# Patient Record
Sex: Male | Born: 1955 | Race: White | Hispanic: No | Marital: Married | Smoking: Never smoker
Health system: Southern US, Community
[De-identification: ages and names within clinical notes are randomized; demographics above are authoritative.]

## PROBLEM LIST (undated history)

## (undated) DIAGNOSIS — E039 Hypothyroidism, unspecified: Secondary | ICD-10-CM

## (undated) DIAGNOSIS — Z87442 Personal history of urinary calculi: Secondary | ICD-10-CM

## (undated) DIAGNOSIS — C801 Malignant (primary) neoplasm, unspecified: Secondary | ICD-10-CM

## (undated) DIAGNOSIS — J069 Acute upper respiratory infection, unspecified: Secondary | ICD-10-CM

## (undated) DIAGNOSIS — K219 Gastro-esophageal reflux disease without esophagitis: Secondary | ICD-10-CM

## (undated) DIAGNOSIS — M1990 Unspecified osteoarthritis, unspecified site: Secondary | ICD-10-CM

## (undated) DIAGNOSIS — Z8489 Family history of other specified conditions: Secondary | ICD-10-CM

## (undated) DIAGNOSIS — E7800 Pure hypercholesterolemia, unspecified: Secondary | ICD-10-CM

## (undated) DIAGNOSIS — I10 Essential (primary) hypertension: Secondary | ICD-10-CM

## (undated) DIAGNOSIS — G4730 Sleep apnea, unspecified: Secondary | ICD-10-CM

## (undated) HISTORY — PX: URETHRAL STRICTURE DILATATION: SHX477

## (undated) HISTORY — PX: TONSILLECTOMY: SUR1361

## (undated) HISTORY — PX: NASAL SEPTUM SURGERY: SHX37

---

## 1998-07-29 HISTORY — PX: UVULOPALATOPHARYNGOPLASTY: SHX827

## 2000-07-14 ENCOUNTER — Encounter: Payer: Self-pay | Admitting: Otolaryngology

## 2000-07-16 ENCOUNTER — Inpatient Hospital Stay (HOSPITAL_COMMUNITY): Admission: RE | Admit: 2000-07-16 | Discharge: 2000-07-18 | Payer: Self-pay | Admitting: Otolaryngology

## 2004-06-06 ENCOUNTER — Ambulatory Visit: Payer: Self-pay | Admitting: Internal Medicine

## 2004-11-19 ENCOUNTER — Ambulatory Visit: Payer: Self-pay | Admitting: Urology

## 2007-12-03 ENCOUNTER — Ambulatory Visit: Payer: Self-pay | Admitting: Unknown Physician Specialty

## 2008-12-23 ENCOUNTER — Emergency Department: Payer: Self-pay | Admitting: Unknown Physician Specialty

## 2009-01-05 ENCOUNTER — Ambulatory Visit: Payer: Self-pay | Admitting: Orthopedic Surgery

## 2009-01-11 ENCOUNTER — Ambulatory Visit: Payer: Self-pay | Admitting: Orthopedic Surgery

## 2009-04-19 ENCOUNTER — Ambulatory Visit: Payer: Self-pay | Admitting: Otolaryngology

## 2010-03-28 ENCOUNTER — Ambulatory Visit: Payer: Self-pay | Admitting: Chiropractic Medicine

## 2011-03-21 ENCOUNTER — Ambulatory Visit: Payer: Self-pay | Admitting: Urology

## 2011-04-25 ENCOUNTER — Ambulatory Visit: Payer: Self-pay | Admitting: Urology

## 2011-12-05 ENCOUNTER — Ambulatory Visit: Payer: Self-pay | Admitting: Urology

## 2012-01-06 ENCOUNTER — Ambulatory Visit: Payer: Self-pay | Admitting: Urology

## 2014-03-18 DIAGNOSIS — L0292 Furuncle, unspecified: Secondary | ICD-10-CM | POA: Insufficient documentation

## 2014-03-18 DIAGNOSIS — W57XXXA Bitten or stung by nonvenomous insect and other nonvenomous arthropods, initial encounter: Secondary | ICD-10-CM | POA: Insufficient documentation

## 2014-03-18 DIAGNOSIS — I10 Essential (primary) hypertension: Secondary | ICD-10-CM | POA: Insufficient documentation

## 2014-11-20 NOTE — Op Note (Signed)
PATIENT NAME:  Spencer Garrett, Spencer Garrett MR#:  [id number] DATE OF BIRTH:  04-21-1956  DATE OF PROCEDURE:  01/06/2012  PREOPERATIVE DIAGNOSES:  1. Urethral stone.  2. Urethral stricture.   POSTOPERATIVE DIAGNOSES: 1. Urethral stone.  2. Urethral stricture.   PROCEDURES: 1. Lithotripsy of the urethral stone with the holmium laser. 2. Visual internal urethrotomy with the holmium laser.  SURGEON: Otelia Limes. Dietrich Pates D   ANESTHETIST: Dr. Marcello Moores   ANESTHETIC METHOD: General per Dr. Marcello Moores and local per Dr. Yves Dill    INDICATIONS: See the dictated history and physical. After informed consent, the patient requested the above procedures.   OPERATIVE SUMMARY: After adequate general anesthesia had been obtained, the patient was placed into dorsal lithotomy position and the perineum was prepped and draped in the usual fashion. The 17 French cystoscope was coupled with the camera and then visually advanced into the distal urethra. Stone was encountered in the mid pendulous urethra. The patient had a stricture distal to that which did not allow passage of the scope. The 365 micron holmium laser fiber was then introduced through the scope and stricture was incised at the 12 o'clock position resulting in a 20 [place] urethra. Scope was then passed to the stone and the stone was actually pushed back to the bulbar urethra. In this location the stone was fully disintegrated with the holmium laser. Scope was then passed into the bladder and bladder was inspected. Both ureteral orifices were identified and had clear efflux. No bladder lesions were identified. At this point the scope was removed. 10 mL of viscous Xylocaine was then instilled within the urethra.   The procedure was then terminated and the patient was transferred to the recovery room in stable condition.   ____________________________ Otelia Limes. Yves Dill, MD [id number] D: 01/06/2012 11:22:46 ET T: 01/06/2012 13:06:53 ET JOB#: [id number]  cc: Otelia Limes. Yves Dill,  MD, <Dictator> Royston Cowper MD ELECTRONICALLY SIGNED 01/07/2012 12:16

## 2015-01-17 ENCOUNTER — Other Ambulatory Visit: Payer: Self-pay | Admitting: Otolaryngology

## 2015-01-17 DIAGNOSIS — R1310 Dysphagia, unspecified: Secondary | ICD-10-CM

## 2015-01-17 DIAGNOSIS — M542 Cervicalgia: Secondary | ICD-10-CM

## 2015-01-19 ENCOUNTER — Ambulatory Visit
Admission: RE | Admit: 2015-01-19 | Discharge: 2015-01-19 | Disposition: A | Discharge status: Home / Self Care | Payer: PRIVATE HEALTH INSURANCE | Source: Ambulatory Visit | Attending: Otolaryngology | Admitting: Otolaryngology

## 2015-01-19 DIAGNOSIS — R1310 Dysphagia, unspecified: Secondary | ICD-10-CM | POA: Insufficient documentation

## 2015-01-19 DIAGNOSIS — M542 Cervicalgia: Secondary | ICD-10-CM | POA: Insufficient documentation

## 2015-01-19 HISTORY — DX: Essential (primary) hypertension: I10

## 2015-01-19 MED ORDER — IOHEXOL 350 MG/ML SOLN
75.0000 mL | Freq: Once | INTRAVENOUS | Status: AC | PRN
  Administered 2015-01-19: 75 mL via INTRAVENOUS

## 2015-01-20 DIAGNOSIS — R0989 Other specified symptoms and signs involving the circulatory and respiratory systems: Secondary | ICD-10-CM | POA: Insufficient documentation

## 2015-01-20 DIAGNOSIS — F32A Depression, unspecified: Secondary | ICD-10-CM | POA: Insufficient documentation

## 2015-01-20 DIAGNOSIS — E039 Hypothyroidism, unspecified: Secondary | ICD-10-CM | POA: Insufficient documentation

## 2015-01-20 DIAGNOSIS — F329 Major depressive disorder, single episode, unspecified: Secondary | ICD-10-CM | POA: Insufficient documentation

## 2015-01-20 DIAGNOSIS — E0590 Thyrotoxicosis, unspecified without thyrotoxic crisis or storm: Secondary | ICD-10-CM | POA: Insufficient documentation

## 2015-02-01 ENCOUNTER — Other Ambulatory Visit: Payer: Self-pay

## 2015-02-06 ENCOUNTER — Encounter: Admission: RE | Payer: Self-pay | Source: Ambulatory Visit

## 2015-02-06 ENCOUNTER — Ambulatory Visit
Admission: RE | Admit: 2015-02-06 | Payer: PRIVATE HEALTH INSURANCE | Source: Ambulatory Visit | Admitting: Otolaryngology

## 2015-02-06 SURGERY — LARYNGOSCOPY
Anesthesia: Choice

## 2016-04-10 ENCOUNTER — Encounter: Payer: Self-pay | Admitting: Intensive Care

## 2016-04-10 ENCOUNTER — Inpatient Hospital Stay
Admission: EM | Admit: 2016-04-10 | Discharge: 2016-04-13 | DRG: 392 | Disposition: A | Discharge status: Home / Self Care | Payer: Managed Care, Other (non HMO) | Attending: Surgery | Admitting: Surgery

## 2016-04-10 ENCOUNTER — Ambulatory Visit
Admission: RE | Admit: 2016-04-10 | Discharge: 2016-04-10 | Disposition: A | Discharge status: Home / Self Care | Payer: Managed Care, Other (non HMO) | Source: Ambulatory Visit | Attending: Urology | Admitting: Urology

## 2016-04-10 ENCOUNTER — Other Ambulatory Visit: Payer: Self-pay | Admitting: Urology

## 2016-04-10 DIAGNOSIS — Z87442 Personal history of urinary calculi: Secondary | ICD-10-CM | POA: Diagnosis not present

## 2016-04-10 DIAGNOSIS — R1011 Right upper quadrant pain: Secondary | ICD-10-CM

## 2016-04-10 DIAGNOSIS — I10 Essential (primary) hypertension: Secondary | ICD-10-CM | POA: Diagnosis present

## 2016-04-10 DIAGNOSIS — K5792 Diverticulitis of intestine, part unspecified, without perforation or abscess without bleeding: Secondary | ICD-10-CM | POA: Diagnosis not present

## 2016-04-10 DIAGNOSIS — Z885 Allergy status to narcotic agent status: Secondary | ICD-10-CM

## 2016-04-10 DIAGNOSIS — K5720 Diverticulitis of large intestine with perforation and abscess without bleeding: Secondary | ICD-10-CM | POA: Diagnosis not present

## 2016-04-10 DIAGNOSIS — K5732 Diverticulitis of large intestine without perforation or abscess without bleeding: Principal | ICD-10-CM | POA: Diagnosis present

## 2016-04-10 HISTORY — DX: Pure hypercholesterolemia, unspecified: E78.00

## 2016-04-10 LAB — URINALYSIS COMPLETE WITH MICROSCOPIC (ARMC ONLY)
BACTERIA UA: NONE SEEN
Bilirubin Urine: NEGATIVE
Glucose, UA: NEGATIVE mg/dL
Ketones, ur: NEGATIVE mg/dL
Leukocytes, UA: NEGATIVE
Nitrite: NEGATIVE
PH: 5 (ref 5.0–8.0)
PROTEIN: NEGATIVE mg/dL
Specific Gravity, Urine: 1.034 — ABNORMAL HIGH (ref 1.005–1.030)

## 2016-04-10 LAB — COMPREHENSIVE METABOLIC PANEL
ALBUMIN: 3.9 g/dL (ref 3.5–5.0)
ALK PHOS: 52 U/L (ref 38–126)
ALT: 17 U/L (ref 17–63)
ANION GAP: 8 (ref 5–15)
AST: 19 U/L (ref 15–41)
BUN: 13 mg/dL (ref 6–20)
CALCIUM: 9 mg/dL (ref 8.9–10.3)
CHLORIDE: 96 mmol/L — AB (ref 101–111)
CO2: 29 mmol/L (ref 22–32)
Creatinine, Ser: 1.12 mg/dL (ref 0.61–1.24)
GFR calc Af Amer: >60 mL/min (ref >60)
GFR calc non Af Amer: >60 mL/min (ref >60)
GLUCOSE: 107 mg/dL — AB (ref 65–99)
Potassium: 3.4 mmol/L — ABNORMAL LOW (ref 3.5–5.1)
SODIUM: 133 mmol/L — AB (ref 135–145)
Total Bilirubin: 1.2 mg/dL (ref 0.3–1.2)
Total Protein: 7.6 g/dL (ref 6.5–8.1)

## 2016-04-10 LAB — CBC
HEMATOCRIT: 46 % (ref 40.0–52.0)
Hemoglobin: 15.8 g/dL (ref 13.0–18.0)
MCH: 29.9 pg (ref 26.0–34.0)
MCHC: 34.3 g/dL (ref 32.0–36.0)
MCV: 87.1 fL (ref 80.0–100.0)
Platelets: 187 10*3/uL (ref 150–440)
RBC: 5.28 MIL/uL (ref 4.40–5.90)
RDW: 14.1 % (ref 11.5–14.5)
WBC: 10.3 10*3/uL (ref 3.8–10.6)

## 2016-04-10 LAB — LIPASE, BLOOD: LIPASE: 25 U/L (ref 11–51)

## 2016-04-10 MED ORDER — MORPHINE SULFATE (PF) 2 MG/ML IV SOLN
2.0000 mg | INTRAVENOUS | Status: DC | PRN
  Administered 2016-04-10 – 2016-04-12 (×12): 2 mg via INTRAVENOUS
  Filled 2016-04-10 (×11): qty 1

## 2016-04-10 MED ORDER — PANTOPRAZOLE SODIUM 40 MG PO TBEC
40.0000 mg | DELAYED_RELEASE_TABLET | Freq: Every day | ORAL | Status: DC
  Administered 2016-04-10 – 2016-04-13 (×4): 40 mg via ORAL
  Filled 2016-04-10 (×4): qty 1

## 2016-04-10 MED ORDER — PIPERACILLIN-TAZOBACTAM 3.375 G IVPB 30 MIN: 3.3750 g | Freq: Three times a day (TID) | INTRAVENOUS | Status: DC

## 2016-04-10 MED ORDER — LISINOPRIL 10 MG PO TABS
10.0000 mg | ORAL_TABLET | Freq: Every day | ORAL | Status: DC
  Administered 2016-04-10 – 2016-04-12 (×3): 10 mg via ORAL
  Filled 2016-04-10 (×3): qty 1

## 2016-04-10 MED ORDER — MORPHINE SULFATE (PF) 2 MG/ML IV SOLN
INTRAVENOUS | Status: AC
  Administered 2016-04-10: 2 mg via INTRAVENOUS
  Filled 2016-04-10: qty 1

## 2016-04-10 MED ORDER — PIPERACILLIN-TAZOBACTAM 3.375 G IVPB
3.3750 g | Freq: Three times a day (TID) | INTRAVENOUS | Status: DC
  Administered 2016-04-10 – 2016-04-13 (×9): 3.375 g via INTRAVENOUS
  Filled 2016-04-10 (×9): qty 50

## 2016-04-10 MED ORDER — MORPHINE SULFATE (PF) 4 MG/ML IV SOLN
4.0000 mg | Freq: Once | INTRAVENOUS | Status: AC
  Administered 2016-04-10: 4 mg via INTRAVENOUS

## 2016-04-10 MED ORDER — ONDANSETRON HCL 4 MG PO TABS: 4.0000 mg | ORAL_TABLET | Freq: Four times a day (QID) | ORAL | Status: DC | PRN

## 2016-04-10 MED ORDER — IOPAMIDOL (ISOVUE-300) INJECTION 61%
100.0000 mL | Freq: Once | INTRAVENOUS | Status: AC | PRN
  Administered 2016-04-10: 100 mL via INTRAVENOUS

## 2016-04-10 MED ORDER — ATORVASTATIN CALCIUM 20 MG PO TABS
20.0000 mg | ORAL_TABLET | Freq: Every day | ORAL | Status: DC
  Administered 2016-04-10 – 2016-04-12 (×3): 20 mg via ORAL
  Filled 2016-04-10 (×3): qty 1

## 2016-04-10 MED ORDER — ONDANSETRON HCL 4 MG/2ML IJ SOLN
4.0000 mg | Freq: Once | INTRAMUSCULAR | Status: AC
  Administered 2016-04-10: 4 mg via INTRAVENOUS

## 2016-04-10 MED ORDER — ALPRAZOLAM 1 MG PO TABS
1.0000 mg | ORAL_TABLET | Freq: Every evening | ORAL | Status: DC | PRN
Start: 2016-04-10 — End: 2016-04-13
  Administered 2016-04-10 – 2016-04-12 (×3): 1 mg via ORAL
  Filled 2016-04-10 (×3): qty 1

## 2016-04-10 MED ORDER — ONDANSETRON HCL 4 MG/2ML IJ SOLN
4.0000 mg | Freq: Four times a day (QID) | INTRAMUSCULAR | Status: DC | PRN
  Filled 2016-04-10: qty 2

## 2016-04-10 MED ORDER — PHENTERMINE HCL 30 MG PO CAPS: 30.0000 mg | ORAL_CAPSULE | ORAL | Status: DC

## 2016-04-10 MED ORDER — HEPARIN SODIUM (PORCINE) 5000 UNIT/ML IJ SOLN
5000.0000 [IU] | Freq: Three times a day (TID) | INTRAMUSCULAR | Status: DC
  Administered 2016-04-11 – 2016-04-13 (×7): 5000 [IU] via SUBCUTANEOUS
  Filled 2016-04-10 (×8): qty 1

## 2016-04-10 MED ORDER — ALPRAZOLAM 0.5 MG PO TABS
0.2500 mg | ORAL_TABLET | Freq: Every evening | ORAL | Status: DC | PRN
  Filled 2016-04-10: qty 1

## 2016-04-10 MED ORDER — ONDANSETRON HCL 4 MG/2ML IJ SOLN
INTRAMUSCULAR | Status: AC
  Administered 2016-04-10: 4 mg via INTRAVENOUS
  Filled 2016-04-10: qty 2

## 2016-04-10 MED ORDER — HYDROCHLOROTHIAZIDE 25 MG PO TABS
25.0000 mg | ORAL_TABLET | Freq: Every day | ORAL | Status: DC
  Administered 2016-04-11 – 2016-04-13 (×3): 25 mg via ORAL
  Filled 2016-04-10 (×3): qty 1

## 2016-04-10 MED ORDER — KETOROLAC TROMETHAMINE 30 MG/ML IJ SOLN
30.0000 mg | Freq: Four times a day (QID) | INTRAMUSCULAR | Status: DC | PRN
  Administered 2016-04-10 – 2016-04-12 (×3): 30 mg via INTRAVENOUS
  Filled 2016-04-10 (×2): qty 1

## 2016-04-10 MED ORDER — MORPHINE SULFATE (PF) 4 MG/ML IV SOLN
INTRAVENOUS | Status: AC
  Administered 2016-04-10: 4 mg via INTRAVENOUS
  Filled 2016-04-10: qty 1

## 2016-04-10 MED ORDER — LEVOTHYROXINE SODIUM 50 MCG PO TABS
75.0000 ug | ORAL_TABLET | Freq: Every day | ORAL | Status: DC
  Administered 2016-04-11 – 2016-04-13 (×3): 75 ug via ORAL
  Filled 2016-04-10 (×3): qty 2

## 2016-04-10 MED ORDER — LACTATED RINGERS IV SOLN
INTRAVENOUS | Status: DC
  Administered 2016-04-10: 20:00:00 via INTRAVENOUS
  Administered 2016-04-11 (×2): 1000 mL via INTRAVENOUS
  Administered 2016-04-11 – 2016-04-12 (×3): via INTRAVENOUS

## 2016-04-10 NOTE — ED Provider Notes (Signed)
[facility] Emergency Department Provider Note   ____________________________________________   First MD Initiated Contact with Patient 04/10/16 1730     (approximate)  I have reviewed the triage vital signs and the nursing notes.   HISTORY  Chief Complaint Abdominal Pain and Diverticulitis    HPI Spencer Garrett is a 60 y.o. male patient reports onset of abdominal pain in the lower abdomen across the bottom of the abdomen patient went to medical and surgical urgent care and had a CT there that showed a microperforation and diverticulitis and sigmoid colon was sent here to the emergency room on arrival in the emergency room I examined the patient and certainly history was correct. I then called Dr. Burt Knack who is on-call for surgery and Dr. Antionette Char coming down to see the patient. Patient reports abdominal pain is moderate to moderately severe. It isn'Spencer across the lower abdomen and is worse with movement or percussion. He has some decreased appetite as I understand it.   Past Medical History:  Diagnosis Date  . High cholesterol   . Hypertension   . Thyroid disease     There are no active problems to display for this patient.   Past Surgical History:  Procedure Laterality Date  . URETHRAL STRICTURE DILATATION      Prior to Admission medications   Not on File    Allergies Pineapple  History reviewed. No pertinent family history.  Social History Social History  Substance Use Topics  . Smoking status: Never Smoker  . Smokeless tobacco: Never Used  . Alcohol use 1.8 oz/week    2 Cans of beer, 1 Shots of liquor per week     Comment: about 16oz of beer a day and 4oz of liquor a day    Review of Systems Constitutional: No fever/chills Eyes: No visual changes. ENT: No sore throat. Cardiovascular: Denies chest pain. Respiratory: Denies shortness of breath. Gastrointestinal: See history of present illness. Genitourinary: Negative for  dysuria. Musculoskeletal: Negative for back pain.   10-point ROS otherwise negative.  ____________________________________________   PHYSICAL EXAM:  VITAL SIGNS: ED Triage Vitals  Enc Vitals Group     BP 04/10/16 1520 130/89     Pulse Rate 04/10/16 1520 (!) 114     Resp 04/10/16 1520 20     Temp 04/10/16 1520 99 F (37.2 C)     Temp Source 04/10/16 1520 Oral     SpO2 04/10/16 1520 97 %     Weight 04/10/16 1521 244 lb (110.7 kg)     Height 04/10/16 1521 5\' 9"  (1.753 m)     Head Circumference --      Peak Flow --      Pain Score 04/10/16 1521 6     Pain Loc --      Pain Edu? --      Excl. in [place]? --     Constitutional: Alert and oriented. Well appearing and in no acute distress. Eyes: Conjunctivae are normal. PERRL. EOMI. Head: Atraumatic. Nose: No congestion/rhinnorhea. Mouth/Throat: Mucous membranes are moist.  Oropharynx non-erythematous. Neck: No stridor.   Cardiovascular: Normal rate, regular rhythm. Grossly normal heart sounds.  Good peripheral circulation. Respiratory: Normal respiratory effort.  No retractions. Lungs CTAB. Gastrointestinal:Abdomen is soft mildly tender to very light palpation percussion in the lower abdomen only I did not palpate or percuss any harde Musculoskeletal: No lower extremity tenderness nor edema.  No joint effusions. Neurologic:  Normal speech and language. No gross focal neurologic deficits are  appreciated. No gait instability. Skin:  Skin is warm, dry and intact. No rash noted. Psychiatric: Mood and affect are normal. Speech and behavior are normal.  ____________________________________________   LABS (all labs ordered are listed, but only abnormal results are displayed)  Labs Reviewed  COMPREHENSIVE METABOLIC PANEL - Abnormal; Notable for the following:       Result Value   Sodium 133 (*)    Potassium 3.4 (*)    Chloride 96 (*)    Glucose, Bld 107 (*)    All other components within normal limits  LIPASE, BLOOD  CBC   URINALYSIS COMPLETEWITH MICROSCOPIC ([facility] ONLY)   ____________________________________________  EKG   ____________________________________________  RADIOLOGY  Please see previous note for CT report which shows microperforation. ____________________________________________   PROCEDURES  Procedure(s) performed: Procedures  Critical Care performed: ____________________________________________   INITIAL IMPRESSION / ASSESSMENT AND PLAN / ED COURSE  Pertinent labs & imaging results that were available during my care of the patient were reviewed by me and considered in my medical decision making (see chart for details).    Clinical Course     ____________________________________________   FINAL CLINICAL IMPRESSION(S) / ED DIAGNOSES  Final diagnoses:  Diverticulitis of large intestine with perforation without bleeding      NEW MEDICATIONS STARTED DURING THIS VISIT:  New Prescriptions   No medications on file     Note:  This document was prepared using Dragon voice recognition software and may include unintentional dictation errors.    Nena Polio, MD 04/10/16 [id number]

## 2016-04-10 NOTE — H&P (Signed)
Spencer Garrett is an 60 y.o. male.    Chief Complaint: Lower abdominal pain  HPI: This patient with a diagnosis of acute diverticulitis with microperforation seen on CT scan. The emergency room physician asked me to see the patient for admission.  Patient describes the onset of bilateral lower quadrant tenderness on Saturday or so 5 days ago and he had seen his urologist taking that this was kidney stones although the pain was different than his usual kidney stones. He's had multiple kidney stone procedures and has had urethral strictures in the past but this pain was very different for him. He was started on antibiotics presuming that it was a kidney stone but is not improved and has actually worsened. He had an outpatient CT scan suggestive of acute diverticulitis with microperforation.  He does not think he's had any fevers has not had a chills has never had an episode like this before denies melena or hematochezia  Patient works in [employer] does not smoke or drink no family history Past Medical History:  Diagnosis Date  . High cholesterol   . Hypertension   . Thyroid disease     Past Surgical History:  Procedure Laterality Date  . URETHRAL STRICTURE DILATATION      History reviewed. No pertinent family history. Social History:  reports that he has never smoked. He has never used smokeless tobacco. He reports that he drinks about 1.8 oz of alcohol per week . He reports that he does not use drugs.  Allergies:  Allergies  Allergen Reactions  . Oxycodone Itching  . Pineapple Hives     (Not in a hospital admission)   Review of Systems  Constitutional: Negative for chills and fever.  HENT: Negative.   Eyes: Negative.   Respiratory: Negative.   Cardiovascular: Negative.   Gastrointestinal: Positive for abdominal pain. Negative for blood in stool, constipation, diarrhea, heartburn, melena, nausea and vomiting.  Genitourinary: Negative.   Musculoskeletal: Negative.   Skin:  Negative.   Neurological: Negative.   Endo/Heme/Allergies: Negative.   Psychiatric/Behavioral: Negative.      Physical Exam:  BP (!) 129/99 (BP Location: Right Arm)   Pulse 100   Temp 99 F (37.2 C) (Oral)   Resp 16   Ht _0  (1.753 m)   Wt 244 lb (110.7 kg)   SpO2 97%   BMI 36.03 kg/m   Physical Exam  Constitutional: He is oriented to person, place, and time and well-developed, well-nourished, and in no distress. No distress.  HENT:  Head: Normocephalic and atraumatic.  Eyes: Right eye exhibits no discharge. Left eye exhibits no discharge. No scleral icterus.  Neck: Normal range of motion.  Cardiovascular: Normal rate, regular rhythm and normal heart sounds.   Pulmonary/Chest: Effort normal and breath sounds normal. No respiratory distress. He has no wheezes. He has no rales.  Abdominal: Soft. He exhibits no distension. There is tenderness. There is guarding. There is no rebound.  Tender in both lower quadrant minor guarding no rebound no percussion tenderness  Musculoskeletal: Normal range of motion. He exhibits no edema or tenderness.  Lymphadenopathy:    He has no cervical adenopathy.  Neurological: He is alert and oriented to person, place, and time.  Skin: Skin is warm and dry. No rash noted. He is not diaphoretic. No erythema.  Psychiatric: Mood and affect normal.  Vitals reviewed.       Results for orders placed or performed during the hospital encounter of 04/10/16 (from the past 48 hour(s))  Lipase, blood     Status: None   Collection Time: 04/10/16  3:26 PM  Result Value Ref Range   Lipase 25 11 - 51 U/L  Comprehensive metabolic panel     Status: Abnormal   Collection Time: 04/10/16  3:26 PM  Result Value Ref Range   Sodium 133 (L) 135 - 145 mmol/L   Potassium 3.4 (L) 3.5 - 5.1 mmol/L   Chloride 96 (L) 101 - 111 mmol/L   CO2 29 22 - 32 mmol/L   Glucose, Bld 107 (H) 65 - 99 mg/dL   BUN 13 6 - 20 mg/dL   Creatinine, Ser 1.12 0.61 - 1.24 mg/dL    Calcium 9.0 8.9 - 10.3 mg/dL   Total Protein 7.6 6.5 - 8.1 g/dL   Albumin 3.9 3.5 - 5.0 g/dL   AST 19 15 - 41 U/L   ALT 17 17 - 63 U/L   Alkaline Phosphatase 52 38 - 126 U/L   Total Bilirubin 1.2 0.3 - 1.2 mg/dL   GFR calc non Af Amer >60 >60 mL/min   GFR calc Af Amer >60 >60 mL/min    Comment: (NOTE) The eGFR has been calculated using the CKD EPI equation. This calculation has not been validated in all clinical situations. eGFR's persistently <60 mL/min signify possible Chronic Kidney Disease.    Anion gap 8 5 - 15  CBC     Status: None   Collection Time: 04/10/16  3:26 PM  Result Value Ref Range   WBC 10.3 3.8 - 10.6 K/uL   RBC 5.28 4.40 - 5.90 MIL/uL   Hemoglobin 15.8 13.0 - 18.0 g/dL   HCT 46.0 40.0 - 52.0 %   MCV 87.1 80.0 - 100.0 fL   MCH 29.9 26.0 - 34.0 pg   MCHC 34.3 32.0 - 36.0 g/dL   RDW 14.1 11.5 - 14.5 %   Platelets 187 150 - 440 K/uL  Urinalysis complete, with microscopic     Status: Abnormal   Collection Time: 04/10/16  5:21 PM  Result Value Ref Range   Color, Urine YELLOW (A) YELLOW   APPearance CLEAR (A) CLEAR   Glucose, UA NEGATIVE NEGATIVE mg/dL   Bilirubin Urine NEGATIVE NEGATIVE   Ketones, ur NEGATIVE NEGATIVE mg/dL   Specific Gravity, Urine 1.034 (H) 1.005 - 1.030   Hgb urine dipstick 2+ (A) NEGATIVE   pH 5.0 5.0 - 8.0   Protein, ur NEGATIVE NEGATIVE mg/dL   Nitrite NEGATIVE NEGATIVE   Leukocytes, UA NEGATIVE NEGATIVE   RBC / HPF 0-5 0 - 5 RBC/hpf   WBC, UA 0-5 0 - 5 WBC/hpf   Bacteria, UA NONE SEEN NONE SEEN   Squamous Epithelial / LPF 0-5 (A) NONE SEEN   Mucous PRESENT    Ct Abdomen Pelvis W Wo Contrast  Result Date: 04/10/2016 CLINICAL DATA:  Severe right-sided abdominal and pelvic pain for 5 days. Nephrolithiasis. Clinical suspicion for ureteral calculus or diverticulitis. EXAM: CT ABDOMEN AND PELVIS WITHOUT AND WITH CONTRAST TECHNIQUE: Multidetector CT imaging of the abdomen and pelvis was performed following the standard protocol before  and following the bolus administration of intravenous contrast. CONTRAST:  135m ISOVUE-300 IOPAMIDOL (ISOVUE-300) INJECTION 61% COMPARISON:  None. FINDINGS: Lower Chest: No acute findings. Hepatobiliary: No mass identified. Diffuse hepatic steatosis noted. Gallbladder is unremarkable. Pancreas:  No mass or inflammatory changes. Spleen:  Unremarkable. Adrenals/Urinary Tract: Normal appearance of adrenal glands. Tiny less than 5 mm intrarenal calculi are seen bilaterally. No evidence of ureteral calculi or hydronephrosis. Several  small benign-appearing bilateral renal cysts are noted, however no renal masses are identified. Unopacified urinary bladder is unremarkable in appearance. Stomach/Bowel: No evidence of bowel obstruction. Moderate sigmoid diverticulitis is demonstrated. A tiny amount of air is seen within the adjacent sigmoid mesocolon, consistent with micro perforation. No evidence of abscess or free intraperitoneal air. Vascular/Lymphatic: No pathologically enlarged lymph nodes. No abdominal aortic aneurysm. Aortic atherosclerosis. Reproductive:  No mass identified. Other:  None. Musculoskeletal:  No suspicious bone lesions identified. IMPRESSION: Moderate sigmoid diverticulitis with microperforation. No evidence of abscess or free intraperitoneal air. Bilateral nephrolithiasis. No evidence of ureteral calculi or hydronephrosis. Incidentally noted aortic atherosclerosis and hepatic steatosis. Electronically Signed   By: Earle Gell M.D.   On: 04/10/2016 12:49     Assessment/Plan  Acute diverticulitis with microperforation. I discussed with he and his family members the rationale for offering admission to the hospital with IV antibiotics and the risks of worsening needing emergent or urgent surgery that might result in a colostomy. This is all reviewed with him and he agrees with admission hospital for IV antibiotic therapy at this point.  Florene Glen, MD, FACS

## 2016-04-10 NOTE — ED Triage Notes (Signed)
Patient was sent here by [facility] surgical center after Ct scan showed Diverticulitis with microperf. PT reports RLQ and LLQ abd pain since Sunday.

## 2016-04-11 DIAGNOSIS — K5720 Diverticulitis of large intestine with perforation and abscess without bleeding: Secondary | ICD-10-CM

## 2016-04-11 LAB — BASIC METABOLIC PANEL
Anion gap: 5 (ref 5–15)
BUN: 11 mg/dL (ref 6–20)
CHLORIDE: 99 mmol/L — AB (ref 101–111)
CO2: 31 mmol/L (ref 22–32)
CREATININE: 1.14 mg/dL (ref 0.61–1.24)
Calcium: 8.7 mg/dL — ABNORMAL LOW (ref 8.9–10.3)
GFR calc Af Amer: >60 mL/min (ref >60)
GFR calc non Af Amer: >60 mL/min (ref >60)
Glucose, Bld: 116 mg/dL — ABNORMAL HIGH (ref 65–99)
POTASSIUM: 3.4 mmol/L — AB (ref 3.5–5.1)
SODIUM: 135 mmol/L (ref 135–145)

## 2016-04-11 LAB — CBC
HEMATOCRIT: 42.3 % (ref 40.0–52.0)
Hemoglobin: 14.8 g/dL (ref 13.0–18.0)
MCH: 30.2 pg (ref 26.0–34.0)
MCHC: 35 g/dL (ref 32.0–36.0)
MCV: 86.4 fL (ref 80.0–100.0)
PLATELETS: 158 10*3/uL (ref 150–440)
RBC: 4.9 MIL/uL (ref 4.40–5.90)
RDW: 14 % (ref 11.5–14.5)
WBC: 6.7 10*3/uL (ref 3.8–10.6)

## 2016-04-11 NOTE — Progress Notes (Signed)
CC: Acute diverticulitis with microperforation Subjective: A she states he is feeling a little bit better today but still has left lower quadrant pain he feels frustrated being in the hospital and missing a trip to [place]. He is not passing much gas. Fevers or chills  Objective: Vital signs in last 24 hours: Temp:  [97.6 F (36.4 C)-99 F (37.2 C)] 97.6 F (36.4 C) (09/14 0537) Pulse Rate:  [86-114] 86 (09/14 0537) Resp:  [16-20] 18 (09/14 0537) BP: (110-144)/(73-105) 110/73 (09/14 0537) SpO2:  [97 %-100 %] 99 % (09/14 0537) Weight:  [244 lb (110.7 kg)] 244 lb (110.7 kg) (09/13 1521) Last BM Date: 04/10/16  Intake/Output from previous day: 09/13 0701 - 09/14 0700 In: 1451.8 [P.O.:125; I.V.:1276.8; IV Piggyback:50] Out: -  Intake/Output this shift: Total I/O In: -  Out: 700 [Urine:700]  Physical exam:  Abdomen is distended tympanitic tender in the left lower quadrant more right lower quadrant with minimal guarding. Much change to exam. Calves are nontender. Awake alert and oriented.  Lab Results: CBC   Recent Labs  04/10/16 1526 04/11/16 0521  WBC 10.3 6.7  HGB 15.8 14.8  HCT 46.0 42.3  PLT 187 158   BMET  Recent Labs  04/10/16 1526 04/11/16 0521  NA 133* 135  K 3.4* 3.4*  CL 96* 99*  CO2 29 31  GLUCOSE 107* 116*  BUN 13 11  CREATININE 1.12 1.14  CALCIUM 9.0 8.7*   PT/INR No results for input(s): LABPROT, INR in the last 72 hours. ABG No results for input(s): PHART, HCO3 in the last 72 hours.  Invalid input(s): PCO2, PO2  Studies/Results: Ct Abdomen Pelvis W Wo Contrast  Result Date: 04/10/2016 CLINICAL DATA:  Severe right-sided abdominal and pelvic pain for 5 days. Nephrolithiasis. Clinical suspicion for ureteral calculus or diverticulitis. EXAM: CT ABDOMEN AND PELVIS WITHOUT AND WITH CONTRAST TECHNIQUE: Multidetector CT imaging of the abdomen and pelvis was performed following the standard protocol before and following the bolus administration of  intravenous contrast. CONTRAST:  13mL ISOVUE-300 IOPAMIDOL (ISOVUE-300) INJECTION 61% COMPARISON:  None. FINDINGS: Lower Chest: No acute findings. Hepatobiliary: No mass identified. Diffuse hepatic steatosis noted. Gallbladder is unremarkable. Pancreas:  No mass or inflammatory changes. Spleen:  Unremarkable. Adrenals/Urinary Tract: Normal appearance of adrenal glands. Tiny less than 5 mm intrarenal calculi are seen bilaterally. No evidence of ureteral calculi or hydronephrosis. Several small benign-appearing bilateral renal cysts are noted, however no renal masses are identified. Unopacified urinary bladder is unremarkable in appearance. Stomach/Bowel: No evidence of bowel obstruction. Moderate sigmoid diverticulitis is demonstrated. A tiny amount of air is seen within the adjacent sigmoid mesocolon, consistent with micro perforation. No evidence of abscess or free intraperitoneal air. Vascular/Lymphatic: No pathologically enlarged lymph nodes. No abdominal aortic aneurysm. Aortic atherosclerosis. Reproductive:  No mass identified. Other:  None. Musculoskeletal:  No suspicious bone lesions identified. IMPRESSION: Moderate sigmoid diverticulitis with microperforation. No evidence of abscess or free intraperitoneal air. Bilateral nephrolithiasis. No evidence of ureteral calculi or hydronephrosis. Incidentally noted aortic atherosclerosis and hepatic steatosis. Electronically Signed   By: Earle Gell M.D.   On: 04/10/2016 12:49    Anti-infectives: Anti-infectives    Start     Dose/Rate Route Frequency Ordered Stop   04/10/16 2200  piperacillin-tazobactam (ZOSYN) IVPB 3.375 g  Status:  Discontinued     3.375 g 100 mL/hr over 30 Minutes Intravenous Every 8 hours 04/10/16 1817 04/10/16 1912   04/10/16 2200  piperacillin-tazobactam (ZOSYN) IVPB 3.375 g     3.375 g  12.5 mL/hr over 240 Minutes Intravenous Every 8 hours 04/10/16 1911        Assessment/Plan:  Patient with acute diverticulitis his white blood  cell count is improved he is tolerating clear liquid diet we'll continue IV antibiotics for now would not expect much of a change in his condition for 24-48 hours and hopefully will be able to avoid surgical intervention and a colostomy.  Florene Glen, MD, FACS  04/11/2016

## 2016-04-11 NOTE — Progress Notes (Signed)
Pt. takes Phentermine at home for twenty one days and holds med for seven days then restart. Pharmacy does not carry this med, however,Pt does not need med at this time. Med DC from [facility] after consult with pharmacy.

## 2016-04-12 NOTE — Progress Notes (Signed)
CC: Acute diverticulitis with microperforation Subjective: She feels better today but still has some lower abdominal pain and tenderness no fevers or chills no nausea or vomiting. His wife is present as multiple questions concerning diet and future treatment.  Objective: Vital signs in last 24 hours: Temp:  [97.5 F (36.4 C)-98 F (36.7 C)] 97.5 F (36.4 C) (09/15 0430) Pulse Rate:  [92-93] 93 (09/15 0430) Resp:  [16-18] 16 (09/15 0430) BP: (111-136)/(71-95) 111/71 (09/15 0430) SpO2:  [96 %-100 %] 96 % (09/15 0430) Last BM Date: 04/10/16  Intake/Output from previous day: 09/14 0701 - 09/15 0700 In: 3075 [P.O.:1385; I.V.:1647; IV Piggyback:43] Out: 2125 [Urine:2125] Intake/Output this shift: Total I/O In: -  Out: 400 [Urine:400]  Physical exam:  Awake alert oriented abdomen is soft and minimally tender in both lower quadrants right greater than left with no peritoneal signs calves are nontender  Lab Results: CBC   Recent Labs  04/10/16 1526 04/11/16 0521  WBC 10.3 6.7  HGB 15.8 14.8  HCT 46.0 42.3  PLT 187 158   BMET  Recent Labs  04/10/16 1526 04/11/16 0521  NA 133* 135  K 3.4* 3.4*  CL 96* 99*  CO2 29 31  GLUCOSE 107* 116*  BUN 13 11  CREATININE 1.12 1.14  CALCIUM 9.0 8.7*   PT/INR No results for input(s): LABPROT, INR in the last 72 hours. ABG No results for input(s): PHART, HCO3 in the last 72 hours.  Invalid input(s): PCO2, PO2  Studies/Results: Ct Abdomen Pelvis W Wo Contrast  Result Date: 04/10/2016 CLINICAL DATA:  Severe right-sided abdominal and pelvic pain for 5 days. Nephrolithiasis. Clinical suspicion for ureteral calculus or diverticulitis. EXAM: CT ABDOMEN AND PELVIS WITHOUT AND WITH CONTRAST TECHNIQUE: Multidetector CT imaging of the abdomen and pelvis was performed following the standard protocol before and following the bolus administration of intravenous contrast. CONTRAST:  164mL ISOVUE-300 IOPAMIDOL (ISOVUE-300) INJECTION 61%  COMPARISON:  None. FINDINGS: Lower Chest: No acute findings. Hepatobiliary: No mass identified. Diffuse hepatic steatosis noted. Gallbladder is unremarkable. Pancreas:  No mass or inflammatory changes. Spleen:  Unremarkable. Adrenals/Urinary Tract: Normal appearance of adrenal glands. Tiny less than 5 mm intrarenal calculi are seen bilaterally. No evidence of ureteral calculi or hydronephrosis. Several small benign-appearing bilateral renal cysts are noted, however no renal masses are identified. Unopacified urinary bladder is unremarkable in appearance. Stomach/Bowel: No evidence of bowel obstruction. Moderate sigmoid diverticulitis is demonstrated. A tiny amount of air is seen within the adjacent sigmoid mesocolon, consistent with micro perforation. No evidence of abscess or free intraperitoneal air. Vascular/Lymphatic: No pathologically enlarged lymph nodes. No abdominal aortic aneurysm. Aortic atherosclerosis. Reproductive:  No mass identified. Other:  None. Musculoskeletal:  No suspicious bone lesions identified. IMPRESSION: Moderate sigmoid diverticulitis with microperforation. No evidence of abscess or free intraperitoneal air. Bilateral nephrolithiasis. No evidence of ureteral calculi or hydronephrosis. Incidentally noted aortic atherosclerosis and hepatic steatosis. Electronically Signed   By: Earle Gell M.D.   On: 04/10/2016 12:49    Anti-infectives: Anti-infectives    Start     Dose/Rate Route Frequency Ordered Stop   04/10/16 2200  piperacillin-tazobactam (ZOSYN) IVPB 3.375 g  Status:  Discontinued     3.375 g 100 mL/hr over 30 Minutes Intravenous Every 8 hours 04/10/16 1817 04/10/16 1912   04/10/16 2200  piperacillin-tazobactam (ZOSYN) IVPB 3.375 g     3.375 g 12.5 mL/hr over 240 Minutes Intravenous Every 8 hours 04/10/16 1911        Assessment/Plan:  No labs for review  this morning. Patient seems to be doing better every day he is afebrile and his tenderness is less and compared to  yesterday's exam. We'll continue IV antibiotics 1 more day and likely switch to oral antibiotics tomorrow with discharge. Questions were answered for the patient and his wife and approximate 30 minutes were spent counseling them concerning the future plans for surgical intervention at some point.  Florene Glen, MD, FACS  04/12/2016

## 2016-04-13 LAB — CBC WITH DIFFERENTIAL/PLATELET
BASOS ABS: 0 10*3/uL (ref 0–0.1)
Basophils Relative: 1 %
EOS ABS: 0.1 10*3/uL (ref 0–0.7)
EOS PCT: 2 %
HCT: 41.4 % (ref 40.0–52.0)
Hemoglobin: 14.3 g/dL (ref 13.0–18.0)
LYMPHS PCT: 27 %
Lymphs Abs: 1.5 10*3/uL (ref 1.0–3.6)
MCH: 30.1 pg (ref 26.0–34.0)
MCHC: 34.4 g/dL (ref 32.0–36.0)
MCV: 87.3 fL (ref 80.0–100.0)
MONO ABS: 0.7 10*3/uL (ref 0.2–1.0)
Monocytes Relative: 12 %
Neutro Abs: 3.4 10*3/uL (ref 1.4–6.5)
Neutrophils Relative %: 60 %
Platelets: 191 10*3/uL (ref 150–440)
RBC: 4.74 MIL/uL (ref 4.40–5.90)
RDW: 13.8 % (ref 11.5–14.5)
WBC: 5.7 10*3/uL (ref 3.8–10.6)

## 2016-04-13 MED ORDER — AMOXICILLIN-POT CLAVULANATE 875-125 MG PO TABS: 1.0000 | ORAL_TABLET | Freq: Two times a day (BID) | ORAL | 1 refills | Status: DC

## 2016-04-13 NOTE — Discharge Summary (Signed)
Physician Discharge Summary  Patient ID: Spencer Garrett MRN: [id number] DOB/AGE: 1956/02/28 60 y.o.  Admit date: 04/10/2016 Discharge date: 04/13/2016   Discharge Diagnoses:  Active Problems:   Acute diverticulitis   Diverticulitis of large intestine with perforation without bleeding   Procedures:None  Hospital Course: This a patient with acute diverticulitis with microperforation. He was admitted to the hospital with this diagnosis and treated with IV antibiotics. He is feeling much better his white blood cell count and fever curve has normalized and his abdominal pain and tenderness is much improved. He will be discharged in stable condition on oral Augmentin with instructions to maintain a soft diet and follow-up in our office in 10 days. He's also instructed to return to the emergency room should he worsen at any time.  Consults: None  Disposition:      Medication List    TAKE these medications   ALPRAZolam 0.5 MG tablet Commonly known as:  XANAX Take 1 mg by mouth at bedtime as needed for sleep.   amoxicillin-clavulanate 875-125 MG tablet Commonly known as:  AUGMENTIN Take 1 tablet by mouth 2 (two) times daily.   atorvastatin 20 MG tablet Commonly known as:  LIPITOR Take 20 mg by mouth at bedtime.   cyanocobalamin 1000 MCG/ML injection Commonly known as:  (VITAMIN B-12) Inject 1,000 mcg into the muscle every 30 (thirty) days.   hydrochlorothiazide 25 MG tablet Commonly known as:  HYDRODIURIL Take 25 mg by mouth daily.   levothyroxine 75 MCG tablet Commonly known as:  SYNTHROID, LEVOTHROID Take 75 mcg by mouth daily before breakfast.   lisinopril 10 MG tablet Commonly known as:  PRINIVIL,ZESTRIL Take 10 mg by mouth at bedtime.   nitrofurantoin 100 MG capsule Commonly known as:  MACRODANTIN Take 100 mg by mouth 2 (two) times daily.   NUCYNTA 50 MG tablet Generic drug:  tapentadol Take 50 mg by mouth every 6 (six) hours as needed.   omeprazole 40 MG  capsule Commonly known as:  PRILOSEC Take 40 mg by mouth at bedtime.   phentermine 30 MG capsule Take 30 mg by mouth every morning.   testosterone 50 MG/5GM (1%) Gel Commonly known as:  ANDROGEL Place 5 g onto the skin daily.      Follow-up Information    Phoebe Perch, MD Follow up in 2 week(s).   Specialty:  Surgery Contact information: [address] [place]  [id number] [phone]           Florene Glen, MD, FACS

## 2016-04-13 NOTE — Discharge Instructions (Signed)
Soft diet Resume all home medications Take Augmentin as prescribed Follow-up [facility] in 10 days Return to the emergency room should you experience worsening or return of your symptoms.

## 2016-04-13 NOTE — Progress Notes (Signed)
Patient discharged to home. IV site removed. Discharge summary given to patient.

## 2016-04-13 NOTE — Progress Notes (Signed)
CC: Acute diverticulitis Subjective: Patient states that he feels better today no nausea or vomiting is tolerating his diet and has no fevers or chills. He wants to go home today.  Objective: Vital signs in last 24 hours: Temp:  [97.5 F (36.4 C)-98.4 F (36.9 C)] 98.1 F (36.7 C) (09/16 0419) Pulse Rate:  [87-93] 89 (09/16 0419) Resp:  [16-20] 18 (09/16 0419) BP: (102-129)/(69-80) 102/69 (09/16 0419) SpO2:  [97 %-100 %] 97 % (09/16 0419) Last BM Date: 04/10/16  Intake/Output from previous day: 09/15 0701 - 09/16 0700 In: 480 [P.O.:480] Out: 3700 [Urine:3700] Intake/Output this shift: No intake/output data recorded.  Physical exam:  Abdomen is soft less tender but still tender in both lower quadrants with guarding. Nontender calves Patient is afebrile vital signs are stable  Lab Results: CBC   Recent Labs  04/11/16 0521 04/13/16 0644  WBC 6.7 5.7  HGB 14.8 14.3  HCT 42.3 41.4  PLT 158 191   BMET  Recent Labs  04/10/16 1526 04/11/16 0521  NA 133* 135  K 3.4* 3.4*  CL 96* 99*  CO2 29 31  GLUCOSE 107* 116*  BUN 13 11  CREATININE 1.12 1.14  CALCIUM 9.0 8.7*   PT/INR No results for input(s): LABPROT, INR in the last 72 hours. ABG No results for input(s): PHART, HCO3 in the last 72 hours.  Invalid input(s): PCO2, PO2  Studies/Results: No results found.  Anti-infectives: Anti-infectives    Start     Dose/Rate Route Frequency Ordered Stop   04/10/16 2200  piperacillin-tazobactam (ZOSYN) IVPB 3.375 g  Status:  Discontinued     3.375 g 100 mL/hr over 30 Minutes Intravenous Every 8 hours 04/10/16 1817 04/10/16 1912   04/10/16 2200  piperacillin-tazobactam (ZOSYN) IVPB 3.375 g     3.375 g 12.5 mL/hr over 240 Minutes Intravenous Every 8 hours 04/10/16 1911        Assessment/Plan:  Patient really wants to go home today but I believe he has not improved significantly to allow him to switch off of the IV antibiotics to oral antibiotics. I discussed with  he and his family the potential for him worsening if we switched to oral antibiotics to soon. He has reluctantly agreed to stay in the hospital and I will reassess this afternoon if he is any better.  Florene Glen, MD, FACS  04/13/2016

## 2016-04-15 ENCOUNTER — Other Ambulatory Visit: Payer: PRIVATE HEALTH INSURANCE

## 2016-04-16 ENCOUNTER — Telehealth: Payer: Self-pay | Admitting: Surgery

## 2016-04-16 NOTE — Telephone Encounter (Signed)
Patient was admitted diverticulitis  with perforation. He had to cancel his airline ticket and needs a note stating he was admitted under life threating conditions.   [employer] Call patient when it's ready to pick up.

## 2016-04-17 NOTE — Telephone Encounter (Signed)
Letter has been written and is available for pick-up in the [place] office.  Please notify patient of this.

## 2016-04-18 NOTE — Telephone Encounter (Signed)
Left voice message with patient that his note was ready for pick up in the [place] office

## 2016-04-19 ENCOUNTER — Telehealth: Payer: Self-pay

## 2016-04-19 NOTE — Telephone Encounter (Signed)
Spoke with patient at this time. He states that he is had increased pain yesterday evening and last night after trying to eat a hamburger steak. He has since backed off on his foot to a soft / liquid diet and is feeling much better today. He denies nausea and vomiting, fever, or worsening pain at this time.  Patient asked if he may have pain medication in case the pain starts again. I explained that he should report to the Emergency room over the weekend if he has worsening pain, nausea/vomiting, and/or fever as this may be a sign of worsening diverticulitis despite antibiotic use.  Patient placed on schedule to follow-up with Dr. Adonis Huguenin this coming Monday, 04/22/16 to make sure he is indeed getting better and may call to cancel appointment if he is feeling much better.

## 2016-04-19 NOTE — Telephone Encounter (Signed)
Pt called today stating he saw Dr. Burt Knack at the ED on 04/10/16 for diverticulitis. During that visit he stated Dr. Burt Knack ask him if he needed any pain medication but he refused due to not being in a lot of pain at that time. He stated that now that he is introducing more foods in his diet and he has starting to have increased abdominal pain (a little higher in his abdomen) and increased diarrhea. No nausea, vomiting or fever noted. Please contact pt to discuss possible pain med rx. [phone].

## 2016-04-22 ENCOUNTER — Ambulatory Visit: Payer: Self-pay | Admitting: General Surgery

## 2016-04-22 ENCOUNTER — Other Ambulatory Visit: Payer: Self-pay

## 2016-04-22 DIAGNOSIS — R7302 Impaired glucose tolerance (oral): Secondary | ICD-10-CM | POA: Insufficient documentation

## 2016-04-22 DIAGNOSIS — E785 Hyperlipidemia, unspecified: Secondary | ICD-10-CM | POA: Insufficient documentation

## 2016-04-22 DIAGNOSIS — I10 Essential (primary) hypertension: Secondary | ICD-10-CM | POA: Insufficient documentation

## 2016-04-23 ENCOUNTER — Encounter: Admission: RE | Payer: Self-pay | Source: Ambulatory Visit

## 2016-04-23 ENCOUNTER — Ambulatory Visit: Admission: RE | Admit: 2016-04-23 | Payer: Managed Care, Other (non HMO) | Source: Ambulatory Visit | Admitting: Urology

## 2016-04-23 SURGERY — CYSTOSCOPY, WITH DIRECT VISION INTERNAL URETHROTOMY
Anesthesia: Choice

## 2016-04-30 ENCOUNTER — Encounter: Payer: Self-pay | Admitting: Surgery

## 2016-04-30 ENCOUNTER — Other Ambulatory Visit: Payer: Self-pay

## 2016-04-30 ENCOUNTER — Ambulatory Visit (INDEPENDENT_AMBULATORY_CARE_PROVIDER_SITE_OTHER): Payer: Managed Care, Other (non HMO) | Admitting: Surgery

## 2016-04-30 VITALS — BP 143/103 | HR 84 | Temp 97.8°F | Ht 69.0 in | Wt 242.0 lb

## 2016-04-30 DIAGNOSIS — K5792 Diverticulitis of intestine, part unspecified, without perforation or abscess without bleeding: Secondary | ICD-10-CM

## 2016-04-30 NOTE — Progress Notes (Signed)
Outpatient Surgical Follow Up  04/30/2016  Spencer Garrett is an 60 y.o. male.   CC: Diverticulitis  HPI: This patient with perforated diverticulitis hospitalized on IV antibiotics for several days he has finished his oral antibiotic course and feels well. Patient has no problems today he is constipated periodically.  Past Medical History:  Diagnosis Date  . High cholesterol   . Hypertension   . Thyroid disease     Past Surgical History:  Procedure Laterality Date  . URETHRAL STRICTURE DILATATION    . UVULOPALATOPHARYNGOPLASTY  2000    Family History  Problem Relation Age of Onset  . Lung cancer Mother   . Hypertension Mother   . Emphysema Father   . Diabetes Sister   . Heart disease Maternal Grandfather     Social History:  reports that he has never smoked. He has never used smokeless tobacco. He reports that he drinks about 1.8 oz of alcohol per week . He reports that he does not use drugs.  Allergies:  Allergies  Allergen Reactions  . Oxycodone Itching  . Pineapple Hives and Rash    Medications reviewed.   Review of Systems:   Review of Systems  Constitutional: Negative for chills and fever.  HENT: Negative.   Eyes: Negative.   Respiratory: Negative.   Cardiovascular: Negative.   Gastrointestinal: Positive for constipation. Negative for abdominal pain, blood in stool, nausea and vomiting.  Genitourinary: Negative.   Musculoskeletal: Negative.   Skin: Negative.   Neurological: Negative.   Endo/Heme/Allergies: Negative.   Psychiatric/Behavioral: Negative.      Physical Exam:  BP (!) 143/103   Pulse 84   Temp 97.8 F (36.6 C) (Oral)   Ht 5\' 9"  (1.753 m)   Wt 242 lb (109.8 kg)   BMI 35.74 kg/m   Physical Exam  Constitutional: He is oriented to person, place, and time and well-developed, well-nourished, and in no distress. No distress.  HENT:  Head: Normocephalic and atraumatic.  Eyes: Pupils are equal, round, and reactive to light. Right eye  exhibits no discharge. Left eye exhibits no discharge. No scleral icterus.  Neck: Normal range of motion.  Cardiovascular: Normal rate, regular rhythm and normal heart sounds.   Pulmonary/Chest: Effort normal and breath sounds normal. No respiratory distress. He has no wheezes. He has no rales.  Abdominal: He exhibits no distension. There is no tenderness. There is no rebound and no guarding.  Musculoskeletal: Normal range of motion. He exhibits no edema or tenderness.  Lymphadenopathy:    He has no cervical adenopathy.  Neurological: He is alert and oriented to person, place, and time.  Skin: Skin is warm and dry. No rash noted. He is not diaphoretic. No erythema.  Psychiatric: Mood and affect normal.  Vitals reviewed.     No results found for this or any previous visit (from the past 48 hour(s)). No results found.  Assessment/Plan:  Acute diverticulitis with perforation or microperforation. He is currently asymptomatic and doing well. I've recommended that he utilize a stool softener and not a laxative due to the risk of perforation or he could use a suppository not an enema.  He will also require a colonoscopy in the next few weeks in planning for future surgery he understood and agreed with this plan. He's also counseled concerning calling us should his symptoms return and we can get oral anabiotic started immediately.  Florene Glen, MD, FACS

## 2016-04-30 NOTE — Patient Instructions (Signed)
Please see your Colonoscopy information given. Your prep has been sent to your pharmacy.

## 2016-05-08 DIAGNOSIS — N35919 Unspecified urethral stricture, male, unspecified site: Secondary | ICD-10-CM | POA: Insufficient documentation

## 2016-05-14 ENCOUNTER — Encounter: Payer: Self-pay | Admitting: *Deleted

## 2016-05-16 NOTE — Discharge Instructions (Signed)

## 2016-05-20 ENCOUNTER — Ambulatory Visit: Payer: Managed Care, Other (non HMO) | Admitting: Anesthesiology

## 2016-05-20 ENCOUNTER — Encounter
Admission: RE | Disposition: A | Discharge status: Home / Self Care | Payer: Self-pay | Source: Ambulatory Visit | Attending: Gastroenterology

## 2016-05-20 ENCOUNTER — Ambulatory Visit
Admission: RE | Admit: 2016-05-20 | Discharge: 2016-05-20 | Disposition: A | Discharge status: Home / Self Care | Payer: Managed Care, Other (non HMO) | Source: Ambulatory Visit | Attending: Gastroenterology | Admitting: Gastroenterology

## 2016-05-20 DIAGNOSIS — D122 Benign neoplasm of ascending colon: Secondary | ICD-10-CM | POA: Diagnosis not present

## 2016-05-20 DIAGNOSIS — K5732 Diverticulitis of large intestine without perforation or abscess without bleeding: Secondary | ICD-10-CM | POA: Diagnosis not present

## 2016-05-20 DIAGNOSIS — E7800 Pure hypercholesterolemia, unspecified: Secondary | ICD-10-CM | POA: Diagnosis not present

## 2016-05-20 DIAGNOSIS — Z79899 Other long term (current) drug therapy: Secondary | ICD-10-CM | POA: Diagnosis not present

## 2016-05-20 DIAGNOSIS — G4730 Sleep apnea, unspecified: Secondary | ICD-10-CM | POA: Insufficient documentation

## 2016-05-20 DIAGNOSIS — I10 Essential (primary) hypertension: Secondary | ICD-10-CM | POA: Diagnosis not present

## 2016-05-20 DIAGNOSIS — K219 Gastro-esophageal reflux disease without esophagitis: Secondary | ICD-10-CM | POA: Insufficient documentation

## 2016-05-20 DIAGNOSIS — K648 Other hemorrhoids: Secondary | ICD-10-CM | POA: Diagnosis not present

## 2016-05-20 DIAGNOSIS — E039 Hypothyroidism, unspecified: Secondary | ICD-10-CM | POA: Insufficient documentation

## 2016-05-20 DIAGNOSIS — D123 Benign neoplasm of transverse colon: Secondary | ICD-10-CM | POA: Insufficient documentation

## 2016-05-20 HISTORY — DX: Gastro-esophageal reflux disease without esophagitis: K21.9

## 2016-05-20 HISTORY — DX: Family history of other specified conditions: Z84.89

## 2016-05-20 HISTORY — PX: COLONOSCOPY WITH PROPOFOL: SHX5780

## 2016-05-20 HISTORY — DX: Hypothyroidism, unspecified: E03.9

## 2016-05-20 HISTORY — DX: Sleep apnea, unspecified: G47.30

## 2016-05-20 HISTORY — DX: Acute upper respiratory infection, unspecified: J06.9

## 2016-05-20 HISTORY — DX: Unspecified osteoarthritis, unspecified site: M19.90

## 2016-05-20 HISTORY — PX: POLYPECTOMY: SHX5525

## 2016-05-20 SURGERY — COLONOSCOPY WITH PROPOFOL
Anesthesia: Monitor Anesthesia Care | Wound class: Contaminated

## 2016-05-20 MED ORDER — STERILE WATER FOR IRRIGATION IR SOLN
Status: DC | PRN
  Administered 2016-05-20: 12:00:00

## 2016-05-20 MED ORDER — PROPOFOL 10 MG/ML IV BOLUS
INTRAVENOUS | Status: DC | PRN
  Administered 2016-05-20: 70 mg via INTRAVENOUS
  Administered 2016-05-20 (×4): 20 mg via INTRAVENOUS
  Administered 2016-05-20: 10 mg via INTRAVENOUS
  Administered 2016-05-20 (×5): 20 mg via INTRAVENOUS

## 2016-05-20 MED ORDER — LACTATED RINGERS IV SOLN
INTRAVENOUS | Status: DC
  Administered 2016-05-20: 11:00:00 via INTRAVENOUS

## 2016-05-20 MED ORDER — LIDOCAINE HCL (CARDIAC) 20 MG/ML IV SOLN
INTRAVENOUS | Status: DC | PRN
  Administered 2016-05-20: 50 mg via INTRAVENOUS

## 2016-05-20 MED ORDER — OXYCODONE HCL 5 MG/5ML PO SOLN: 5.0000 mg | Freq: Once | ORAL | Status: DC | PRN

## 2016-05-20 MED ORDER — OXYCODONE HCL 5 MG PO TABS: 5.0000 mg | ORAL_TABLET | Freq: Once | ORAL | Status: DC | PRN

## 2016-05-20 SURGICAL SUPPLY — 23 items

## 2016-05-20 NOTE — Anesthesia Postprocedure Evaluation (Signed)
Anesthesia Post Note  Patient: Spencer Garrett  Procedure(s) Performed: Procedure(s) (LRB): COLONOSCOPY WITH PROPOFOL (N/A) POLYPECTOMY  Patient location during evaluation: PACU Anesthesia Type: MAC Level of consciousness: awake and alert Pain management: pain level controlled Vital Signs Assessment: post-procedure vital signs reviewed and stable Respiratory status: spontaneous breathing Cardiovascular status: blood pressure returned to baseline Postop Assessment: no headache Anesthetic complications: no    Jaci Standard, III,  Reyansh Kushnir D

## 2016-05-20 NOTE — Transfer of Care (Signed)
Immediate Anesthesia Transfer of Care Note  Patient: Spencer Garrett  Procedure(s) Performed: Procedure(s): COLONOSCOPY WITH PROPOFOL (N/A) POLYPECTOMY  Patient Location: PACU  Anesthesia Type: MAC  Level of Consciousness: awake, alert  and patient cooperative  Airway and Oxygen Therapy: Patient Spontanous Breathing and Patient connected to supplemental oxygen  Post-op Assessment: Post-op Vital signs reviewed, Patient's Cardiovascular Status Stable, Respiratory Function Stable, Patent Airway and No signs of Nausea or vomiting  Post-op Vital Signs: Reviewed and stable  Complications: No apparent anesthesia complications

## 2016-05-20 NOTE — H&P (Signed)
Lucilla Lame, MD [facility] [address], [place], [place] [address] Phone: [phone] Fax : [phone]  Primary Care Physician:  Leonel Ramsay, MD Primary Gastroenterologist:  Dr. Allen Norris  Pre-Procedure History & Physical: HPI:  Spencer Garrett is a 60 y.o. male is here for an colonoscopy.   Past Medical History:  Diagnosis Date  . Arthritis    finger  . Family history of adverse reaction to anesthesia    mother had some complication that cancelled surgery.  (pt thinks may have had something to do with atropine, but not sure)  . GERD (gastroesophageal reflux disease)   . High cholesterol   . Hypertension   . Hypothyroidism   . Sleep apnea    had CPAP -hasn'Spencer used recently. system not functional  . URI (upper respiratory infection)    dx'd 05/14/16. "mild". starting meds    Past Surgical History:  Procedure Laterality Date  . NASAL SEPTUM SURGERY    . TONSILLECTOMY    . URETHRAL STRICTURE DILATATION    . UVULOPALATOPHARYNGOPLASTY  2000    Prior to Admission medications   Medication Sig Start Date End Date Taking? Authorizing Provider  amoxicillin (AMOXIL) 875 MG tablet Take 875 mg by mouth 2 (two) times daily.   Yes Historical Provider, MD  atorvastatin (LIPITOR) 20 MG tablet Take 20 mg by mouth at bedtime.    Yes Historical Provider, MD  cyanocobalamin (,VITAMIN B-12,) 1000 MCG/ML injection Inject 1,000 mcg into the muscle every 30 (thirty) days.   Yes Historical Provider, MD  hydrochlorothiazide (HYDRODIURIL) 25 MG tablet Take 25 mg by mouth daily.   Yes Historical Provider, MD  levothyroxine (SYNTHROID, LEVOTHROID) 75 MCG tablet Take 75 mcg by mouth daily before breakfast.   Yes Historical Provider, MD  lisinopril (PRINIVIL,ZESTRIL) 10 MG tablet Take 10 mg by mouth at bedtime.    Yes Historical Provider, MD  omeprazole (PRILOSEC) 40 MG capsule Take 40 mg by mouth at bedtime.    Yes Historical Provider, MD  phentermine 30 MG capsule Take 30 mg by mouth every  morning.   Yes Historical Provider, MD  predniSONE (DELTASONE) 20 MG tablet Take 20 mg by mouth daily with breakfast. 2 tabs x 4 days, 1 tab x 4 days   Yes Historical Provider, MD  tadalafil (CIALIS) 5 MG tablet Take by mouth. 04/25/15 05/14/16 Yes Historical Provider, MD  testosterone (ANDROGEL) 50 MG/5GM (1%) GEL Place 5 g onto the skin daily.   Yes Historical Provider, MD  traZODone (DESYREL) 50 MG tablet Take 50 mg by mouth at bedtime as needed for sleep.   Yes Historical Provider, MD  valACYclovir (VALTREX) 1000 MG tablet Take by mouth.   Yes Historical Provider, MD  ALPRAZolam Duanne Moron) 0.5 MG tablet Take 1 mg by mouth at bedtime as needed for sleep.    Historical Provider, MD  HYDROcodone-homatropine (HYCODAN) 5-1.5 MG/5ML syrup Take 5 mLs by mouth every 6 (six) hours as needed for cough.    Historical Provider, MD  tapentadol (NUCYNTA) 50 MG tablet Take 50 mg by mouth every 6 (six) hours as needed.    Historical Provider, MD    Allergies as of 04/30/2016 - Review Complete 04/30/2016  Allergen Reaction Noted  . Oxycodone Itching 04/10/2016  . Pineapple Hives and Rash 04/10/2016    Family History  Problem Relation Age of Onset  . Lung cancer Mother   . Hypertension Mother   . Emphysema Father   . Diabetes Sister   . Heart disease Maternal  Grandfather     Social History   Social History  . Marital status: Married    Spouse name: N/A  . Number of children: N/A  . Years of education: N/A   Occupational History  . Not on file.   Social History Main Topics  . Smoking status: Never Smoker  . Smokeless tobacco: Never Used  . Alcohol use 15.0 oz/week    20 Shots of liquor, 5 Cans of beer per week     Comment: about 16oz of beer a day and 4oz of liquor a day  . Drug use: No  . Sexual activity: Not on file   Other Topics Concern  . Not on file   Social History Narrative  . No narrative on file    Review of Systems: See HPI, otherwise negative ROS  Physical Exam: BP  (!) 146/94   Pulse 81   Temp 98.2 F (36.8 C) (Temporal)   Resp 16   Ht 5\' 9"  (1.753 m)   Wt 237 lb (107.5 kg)   SpO2 95%   BMI 35.00 kg/m  General:   Alert,  pleasant and cooperative in NAD Head:  Normocephalic and atraumatic. Neck:  Supple; no masses or thyromegaly. Lungs:  Clear throughout to auscultation.    Heart:  Regular rate and rhythm. Abdomen:  Soft, nontender and nondistended. Normal bowel sounds, without guarding, and without rebound.   Neurologic:  Alert and  oriented x4;  grossly normal neurologically.  Impression/Plan: Spencer Gene Skalski is here for an colonoscopy to be performed for diverticulitis  Risks, benefits, limitations, and alternatives regarding  colonoscopy have been reviewed with the patient.  Questions have been answered.  All parties agreeable.   Lucilla Lame, MD  05/20/2016, 10:57 AM

## 2016-05-20 NOTE — Anesthesia Preprocedure Evaluation (Signed)
Anesthesia Evaluation  Patient identified by MRN, date of birth, ID band Patient awake    Reviewed: Allergy & Precautions, H&P , NPO status , Patient's Chart, lab work & pertinent test results  Airway Mallampati: II  TM Distance: >3 FB Neck ROM: full    Dental no notable dental hx.    Pulmonary sleep apnea ,    Pulmonary exam normal breath sounds clear to auscultation       Cardiovascular hypertension, On Medications negative cardio ROS Normal cardiovascular exam     Neuro/Psych    GI/Hepatic Neg liver ROS, Medicated,  Endo/Other  negative endocrine ROSHypothyroidism   Renal/GU negative Renal ROS     Musculoskeletal   Abdominal   Peds  Hematology negative hematology ROS (+)   Anesthesia Other Findings   Reproductive/Obstetrics negative OB ROS                             Anesthesia Physical Anesthesia Plan  ASA: II  Anesthesia Plan: MAC   Post-op Pain Management:    Induction:   Airway Management Planned:   Additional Equipment:   Intra-op Plan:   Post-operative Plan:   Informed Consent:   Plan Discussed with:   Anesthesia Plan Comments:         Anesthesia Quick Evaluation

## 2016-05-20 NOTE — Anesthesia Procedure Notes (Signed)
Procedure Name: MAC Performed by: Rochel Privett Pre-anesthesia Checklist: Patient identified, Emergency Drugs available, Suction available, Timeout performed and Patient being monitored Patient Re-evaluated:Patient Re-evaluated prior to inductionOxygen Delivery Method: Nasal cannula Placement Confirmation: positive ETCO2       

## 2016-05-20 NOTE — Op Note (Signed)
[facility]astroenter[facility] Patient Name: Spencer Garrett Procedure Date: 05/20/2016 11:27 AM MRN: [id number] Account #: [id number] Date of Birth: Dec 26, 1955 Admit Type: Outpatient Age: 60 Room: [facility] OR ROOM 01 Gender: Male Note Status: Finalized Procedure:            Colonoscopy Indications:          Diverticulitis Providers:            Lucilla Lame MD, MD Referring MD:         Adrian Prows (Referring MD) Medicines:            Propofol per Anesthesia Complications:        No immediate complications. Procedure:            Pre-Anesthesia Assessment:                       - Prior to the procedure, a History and Physical was                        performed, and patient medications and allergies were                        reviewed. The patient's tolerance of previous                        anesthesia was also reviewed. The risks and benefits of                        the procedure and the sedation options and risks were                        discussed with the patient. All questions were                        answered, and informed consent was obtained. Prior                        Anticoagulants: The patient has taken no previous                        anticoagulant or antiplatelet agents. ASA Grade                        Assessment: II - A patient with mild systemic disease.                        After reviewing the risks and benefits, the patient was                        deemed in satisfactory condition to undergo the                        procedure.                       After obtaining informed consent, the colonoscope was                        passed under direct vision. Throughout the procedure,  the patient's blood pressure, pulse, and oxygen                        saturations were monitored continuously. The was                        introduced through the anus and advanced to the the                        cecum, identified by  appendiceal orifice and ileocecal                        valve. The colonoscopy was performed without                        difficulty. The patient tolerated the procedure well.                        The quality of the bowel preparation was excellent. Findings:      The perianal and digital rectal examinations were normal.      A 4 mm polyp was found in the ascending colon. The polyp was sessile.       The polyp was removed with a cold snare. Resection and retrieval were       complete.      Two sessile polyps were found in the transverse colon. The polyps were 3       to 5 mm in size. These polyps were removed with a cold snare. Resection       and retrieval were complete.      Non-bleeding internal hemorrhoids were found during retroflexion. The       hemorrhoids were Grade II (internal hemorrhoids that prolapse but reduce       spontaneously). Impression:           - One 4 mm polyp in the ascending colon, removed with a                        cold snare. Resected and retrieved.                       - Two 3 to 5 mm polyps in the transverse colon, removed                        with a cold snare. Resected and retrieved.                       - Non-bleeding internal hemorrhoids. Recommendation:       - Discharge patient to home.                       - Resume previous diet.                       - Continue present medications.                       - Repeat colonoscopy in 5 years if polyp adenoma and 10                        years if hyperplastic Procedure Code(s):    ---  Professional ---                       [phone], Colonoscopy, flexible; with removal of tumor(s),                        polyp(s), or other lesion(s) by snare technique Diagnosis Code(s):    --- Professional ---                       [id number], Diverticulitis of large intestine without                        perforation or abscess without bleeding                       D12.2, Benign neoplasm of ascending colon                        D12.3, Benign neoplasm of transverse colon (hepatic                        flexure or splenic flexure) CPT copyright 2016 American Medical Association. All rights reserved. The codes documented in this report are preliminary and upon coder review may  be revised to meet current compliance requirements. Lucilla Lame MD, MD 05/20/2016 11:52:36 AM This report has been signed electronically. Number of Addenda: 0 Note Initiated On: 05/20/2016 11:27 AM Scope Withdrawal Time: 0 hours 7 minutes 3 seconds  Total Procedure Duration: 0 hours 15 minutes 26 seconds       [facility]

## 2016-05-21 ENCOUNTER — Encounter: Payer: Self-pay | Admitting: Gastroenterology

## 2016-05-22 ENCOUNTER — Encounter: Payer: Self-pay | Admitting: Gastroenterology

## 2016-05-23 ENCOUNTER — Encounter: Payer: Self-pay | Admitting: Surgery

## 2016-05-23 ENCOUNTER — Ambulatory Visit (INDEPENDENT_AMBULATORY_CARE_PROVIDER_SITE_OTHER): Payer: Managed Care, Other (non HMO) | Admitting: Surgery

## 2016-05-23 VITALS — BP 150/98 | HR 101 | Temp 97.8°F | Ht 69.0 in | Wt 241.0 lb

## 2016-05-23 DIAGNOSIS — K5792 Diverticulitis of intestine, part unspecified, without perforation or abscess without bleeding: Secondary | ICD-10-CM

## 2016-05-23 NOTE — Progress Notes (Signed)
Outpatient Surgical Follow Up  05/23/2016  Spencer Garrett is an 60 y.o. male.   CC: Acute diverticulitis  HPI: Is currently asymptomatic from his acute diverticulitis bout. He describes no pain no nausea vomiting he has had a colonoscopy with benign pathology and wants to discuss surgical scheduling.  Past Medical History:  Diagnosis Date  . Arthritis    finger  . Family history of adverse reaction to anesthesia    mother had some complication that cancelled surgery.  (pt thinks may have had something to do with atropine, but not sure)  . GERD (gastroesophageal reflux disease)   . High cholesterol   . Hypertension   . Hypothyroidism   . Sleep apnea    had CPAP -hasn'Spencer used recently. system not functional  . URI (upper respiratory infection)    dx'd 05/14/16. "mild". starting meds    Past Surgical History:  Procedure Laterality Date  . COLONOSCOPY WITH PROPOFOL N/A 05/20/2016   Procedure: COLONOSCOPY WITH PROPOFOL;  Surgeon: Lucilla Lame, MD;  Location: [place];  Service: Endoscopy;  Laterality: N/A;  . NASAL SEPTUM SURGERY    . POLYPECTOMY  05/20/2016   Procedure: POLYPECTOMY;  Surgeon: Lucilla Lame, MD;  Location: [place];  Service: Endoscopy;;  . TONSILLECTOMY    . URETHRAL STRICTURE DILATATION    . UVULOPALATOPHARYNGOPLASTY  2000    Family History  Problem Relation Age of Onset  . Lung cancer Mother   . Hypertension Mother   . Emphysema Father   . Diabetes Sister   . Heart disease Maternal Grandfather     Social History:  reports that he has never smoked. He has never used smokeless tobacco. He reports that he drinks about 15.0 oz of alcohol per week . He reports that he does not use drugs.  Allergies:  Allergies  Allergen Reactions  . Oxycodone Itching  . Pineapple Hives and Rash    Medications reviewed.   Review of Systems:   Review of Systems  Constitutional: Negative for chills, fever and weight loss.  HENT: Negative.   Eyes:  Negative.   Respiratory: Negative.   Cardiovascular: Negative.   Gastrointestinal: Negative for abdominal pain, constipation, diarrhea, heartburn, nausea and vomiting.  Genitourinary: Negative.   Musculoskeletal: Negative.   Skin: Negative.   Neurological: Negative.   Endo/Heme/Allergies: Negative.   Psychiatric/Behavioral: Negative.      Physical Exam:  BP (!) 150/98   Pulse (!) 101   Temp 97.8 F (36.6 C) (Oral)   Ht 5\' 9"  (1.753 m)   Wt 241 lb (109.3 kg)   BMI 35.59 kg/m   Physical Exam  Constitutional: He is oriented to person, place, and time and well-developed, well-nourished, and in no distress. No distress.  HENT:  Head: Normocephalic and atraumatic.  Eyes: Right eye exhibits no discharge. Left eye exhibits no discharge. No scleral icterus.  Pulmonary/Chest: Effort normal. No respiratory distress.  Neurological: He is alert and oriented to person, place, and time.  Skin: Skin is warm and dry. No rash noted. He is not diaphoretic. No erythema.  Psychiatric: Mood and affect normal.      No results found for this or any previous visit (from the past 48 hour(s)). No results found.  Assessment/Plan:  I discussed at length the options of surgical intervention and the high risk that he will have another episode of acute diverticulitis at some point resulting in the need for colostomy or at least hospitalization with IV antibiotics.  I reviewed the procedure  itself open versus laparoscopic and the risks of bleeding infection and anastomotic leak colostomy temporary or permanent he understood and wishes to discuss this with his family he will call us with his decision and schedule surgery at his convenience.  Florene Glen, MD, FACS

## 2016-05-23 NOTE — Patient Instructions (Signed)
Please call with your decision regarding surgery.  Call with any questions or concerns that you may have.

## 2016-10-17 ENCOUNTER — Other Ambulatory Visit: Payer: Self-pay | Admitting: Urology

## 2016-10-17 DIAGNOSIS — R1011 Right upper quadrant pain: Secondary | ICD-10-CM

## 2016-10-22 ENCOUNTER — Ambulatory Visit
Admission: RE | Admit: 2016-10-22 | Discharge: 2016-10-22 | Disposition: A | Discharge status: Home / Self Care | Payer: Managed Care, Other (non HMO) | Source: Ambulatory Visit | Attending: Urology | Admitting: Urology

## 2016-10-22 DIAGNOSIS — R932 Abnormal findings on diagnostic imaging of liver and biliary tract: Secondary | ICD-10-CM | POA: Diagnosis not present

## 2016-10-22 DIAGNOSIS — R1011 Right upper quadrant pain: Secondary | ICD-10-CM | POA: Insufficient documentation

## 2016-10-22 DIAGNOSIS — N200 Calculus of kidney: Secondary | ICD-10-CM | POA: Insufficient documentation

## 2016-10-24 ENCOUNTER — Encounter: Payer: Self-pay | Admitting: *Deleted

## 2016-10-24 ENCOUNTER — Ambulatory Visit
Admission: RE | Admit: 2016-10-24 | Discharge: 2016-10-24 | Disposition: A | Discharge status: Home / Self Care | Payer: Managed Care, Other (non HMO) | Source: Ambulatory Visit | Attending: Urology | Admitting: Urology

## 2016-10-24 ENCOUNTER — Encounter
Admission: RE | Disposition: A | Discharge status: Home / Self Care | Payer: Self-pay | Source: Ambulatory Visit | Attending: Urology

## 2016-10-24 DIAGNOSIS — I10 Essential (primary) hypertension: Secondary | ICD-10-CM | POA: Diagnosis not present

## 2016-10-24 DIAGNOSIS — N200 Calculus of kidney: Secondary | ICD-10-CM | POA: Diagnosis present

## 2016-10-24 HISTORY — DX: Personal history of urinary calculi: Z87.442

## 2016-10-24 HISTORY — DX: Malignant (primary) neoplasm, unspecified: C80.1

## 2016-10-24 HISTORY — PX: EXTRACORPOREAL SHOCK WAVE LITHOTRIPSY: SHX1557

## 2016-10-24 SURGERY — LITHOTRIPSY, ESWL
Anesthesia: Moderate Sedation | Laterality: Right

## 2016-10-24 MED ORDER — NUCYNTA 50 MG PO TABS: 50.0000 mg | ORAL_TABLET | Freq: Four times a day (QID) | ORAL | 0 refills | Status: DC | PRN

## 2016-10-24 MED ORDER — DIPHENHYDRAMINE HCL 25 MG PO CAPS
ORAL_CAPSULE | ORAL | Status: AC
  Administered 2016-10-24: 25 mg via ORAL
  Filled 2016-10-24: qty 1

## 2016-10-24 MED ORDER — DIPHENHYDRAMINE HCL 25 MG PO CAPS
25.0000 mg | ORAL_CAPSULE | ORAL | Status: AC
  Administered 2016-10-24: 25 mg via ORAL

## 2016-10-24 MED ORDER — LEVOFLOXACIN 500 MG PO TABS
ORAL_TABLET | ORAL | Status: AC
  Administered 2016-10-24: 500 mg via ORAL
  Filled 2016-10-24: qty 1

## 2016-10-24 MED ORDER — ONDANSETRON 8 MG PO TBDP: 8.0000 mg | ORAL_TABLET | Freq: Four times a day (QID) | ORAL | 3 refills | Status: DC | PRN

## 2016-10-24 MED ORDER — MIDAZOLAM HCL 2 MG/2ML IJ SOLN
1.0000 mg | Freq: Once | INTRAMUSCULAR | Status: AC
  Administered 2016-10-24: 1 mg via INTRAMUSCULAR

## 2016-10-24 MED ORDER — FUROSEMIDE 10 MG/ML IJ SOLN
INTRAMUSCULAR | Status: AC
  Administered 2016-10-24: 10 mg via INTRAVENOUS
  Filled 2016-10-24: qty 2

## 2016-10-24 MED ORDER — PROMETHAZINE HCL 25 MG/ML IJ SOLN
INTRAMUSCULAR | Status: AC
  Administered 2016-10-24: 25 mg via INTRAMUSCULAR
  Filled 2016-10-24: qty 1

## 2016-10-24 MED ORDER — MORPHINE SULFATE (PF) 10 MG/ML IV SOLN
10.0000 mg | Freq: Once | INTRAVENOUS | Status: AC
  Administered 2016-10-24: 10 mg via INTRAMUSCULAR

## 2016-10-24 MED ORDER — DEXTROSE-NACL 5-0.45 % IV SOLN
INTRAVENOUS | Status: DC
  Administered 2016-10-24: 150 mL/h via INTRAVENOUS

## 2016-10-24 MED ORDER — MORPHINE SULFATE (PF) 10 MG/ML IV SOLN
INTRAVENOUS | Status: AC
  Administered 2016-10-24: 10 mg via INTRAMUSCULAR
  Filled 2016-10-24: qty 1

## 2016-10-24 MED ORDER — MIDAZOLAM HCL 2 MG/2ML IJ SOLN
INTRAMUSCULAR | Status: AC
  Administered 2016-10-24: 1 mg via INTRAMUSCULAR
  Filled 2016-10-24: qty 2

## 2016-10-24 MED ORDER — LEVOFLOXACIN 500 MG PO TABS
500.0000 mg | ORAL_TABLET | ORAL | Status: AC
  Administered 2016-10-24: 500 mg via ORAL

## 2016-10-24 MED ORDER — PROMETHAZINE HCL 25 MG/ML IJ SOLN
25.0000 mg | Freq: Once | INTRAMUSCULAR | Status: AC
Start: 2016-10-24 — End: 2016-10-24
  Administered 2016-10-24: 25 mg via INTRAMUSCULAR

## 2016-10-24 MED ORDER — FUROSEMIDE 10 MG/ML IJ SOLN
10.0000 mg | Freq: Once | INTRAMUSCULAR | Status: AC
  Administered 2016-10-24: 10 mg via INTRAVENOUS

## 2016-10-24 NOTE — OR Nursing (Signed)
Pt given 10mg  Lasix at 1305 per Dr Yves Dill order.

## 2016-10-24 NOTE — Discharge Instructions (Signed)
Lithotripsy, Care After °This sheet gives you information about how to care for yourself after your procedure. Your health care provider may also give you more specific instructions. If you have problems or questions, contact your health care provider. °What can I expect after the procedure? °After the procedure, it is common to have: °· Some blood in your urine. This should only last for a few days. °· Soreness in your back, sides, or upper abdomen for a few days. °· Blotches or bruises on your back where the pressure wave entered the skin. °· Pain, discomfort, or nausea when pieces (fragments) of the kidney stone move through the tube that carries urine from the kidney to the bladder (ureter). Stone fragments may pass soon after the procedure, but they may continue to pass for up to 4-8 weeks. °? If you have severe pain or nausea, contact your health care provider. This may be caused by a large stone that was not broken up, and this may mean that you need more treatment. °· Some pain or discomfort during urination. °· Some pain or discomfort in the lower abdomen or (in men) at the base of the penis. ° °Follow these instructions at home: °Medicines °· Take over-the-counter and prescription medicines only as told by your health care provider. °· If you were prescribed an antibiotic medicine, take it as told by your health care provider. Do not stop taking the antibiotic even if you start to feel better. °· Do not drive for 24 hours if you were given a medicine to help you relax (sedative). °· Do not drive or use heavy machinery while taking prescription pain medicine. °Eating and drinking °· Drink enough water and fluids to keep your urine clear or pale yellow. This helps any remaining pieces of the stone to pass. It can also help prevent new stones from forming. °· Eat plenty of fresh fruits and vegetables. °· Follow instructions from your health care provider about eating and drinking restrictions. You may be  instructed: °? To reduce how much salt (sodium) you eat or drink. Check ingredients and nutrition facts on packaged foods and beverages. °? To reduce how much meat you eat. °· Eat the recommended amount of calcium for your age and gender. Ask your health care provider how much calcium you should have. °General instructions °· Get plenty of rest. °· Most people can resume normal activities 1-2 days after the procedure. Ask your health care provider what activities are safe for you. °· If directed, strain all urine through the strainer that was provided by your health care provider. °? Keep all fragments for your health care provider to see. Any stones that are found may be sent to a medical lab for examination. The stone may be as small as a grain of salt. °· Keep all follow-up visits as told by your health care provider. This is important. °Contact a health care provider if: °· You have pain that is severe or does not get better with medicine. °· You have nausea that is severe or does not go away. °· You have blood in your urine longer than your health care provider told you to expect. °· You have more blood in your urine. °· You have pain during urination that does not go away. °· You urinate more frequently than usual and this does not go away. °· You develop a rash or any other possible signs of an allergic reaction. °Get help right away if: °· You have severe pain in   your back, sides, or upper abdomen. °· You have severe pain while urinating. °· Your urine is very dark red. °· You have blood in your stool (feces). °· You cannot pass any urine at all. °· You feel a strong urge to urinate after emptying your bladder. °· You have a fever or chills. °· You develop shortness of breath, difficulty breathing, or chest pain. °· You have severe nausea that leads to persistent vomiting. °· You faint. °Summary °· After this procedure, it is common to have some pain, discomfort, or nausea when pieces (fragments) of the  kidney stone move through the tube that carries urine from the kidney to the bladder (ureter). If this pain or nausea is severe, however, you should contact your health care provider. °· Most people can resume normal activities 1-2 days after the procedure. Ask your health care provider what activities are safe for you. °· Drink enough water and fluids to keep your urine clear or pale yellow. This helps any remaining pieces of the stone to pass, and it can help prevent new stones from forming. °· If directed, strain your urine and keep all fragments for your health care provider to see. Fragments or stones may be as small as a grain of salt. °· Get help right away if you have severe pain in your back, sides, or upper abdomen or have severe pain while urinating. °This information is not intended to replace advice given to you by your health care provider. Make sure you discuss any questions you have with your health care provider. °Document Released: 08/04/2007 Document Revised: 06/05/2016 Document Reviewed: 06/05/2016 °Elsevier Interactive Patient Education © 2017 [employer] ° °

## 2016-10-25 ENCOUNTER — Encounter: Payer: Self-pay | Admitting: Urology

## 2017-02-25 DIAGNOSIS — R251 Tremor, unspecified: Secondary | ICD-10-CM | POA: Insufficient documentation

## 2017-03-25 DIAGNOSIS — R29898 Other symptoms and signs involving the musculoskeletal system: Secondary | ICD-10-CM | POA: Insufficient documentation

## 2017-03-25 DIAGNOSIS — G5691 Unspecified mononeuropathy of right upper limb: Secondary | ICD-10-CM | POA: Insufficient documentation

## 2017-04-09 DIAGNOSIS — R202 Paresthesia of skin: Secondary | ICD-10-CM | POA: Insufficient documentation

## 2017-04-09 DIAGNOSIS — R200 Anesthesia of skin: Secondary | ICD-10-CM | POA: Insufficient documentation

## 2017-04-09 DIAGNOSIS — R531 Weakness: Secondary | ICD-10-CM | POA: Insufficient documentation

## 2017-07-31 ENCOUNTER — Telehealth: Payer: Self-pay

## 2017-07-31 MED ORDER — METRONIDAZOLE 500 MG PO TABS: 500.0000 mg | ORAL_TABLET | Freq: Three times a day (TID) | ORAL | 0 refills | Status: DC

## 2017-07-31 MED ORDER — CIPROFLOXACIN HCL 500 MG PO TABS: 500.0000 mg | ORAL_TABLET | Freq: Two times a day (BID) | ORAL | 0 refills | Status: DC

## 2017-07-31 NOTE — Telephone Encounter (Signed)
Patient notified of Antibiotic sent to pharmacy at this time per Dr.Cooper. Patient added to schedule next week per Angie/Dr.Cooper.

## 2017-08-05 ENCOUNTER — Ambulatory Visit
Admission: RE | Admit: 2017-08-05 | Discharge: 2017-08-05 | Disposition: A | Discharge status: Home / Self Care | Payer: PRIVATE HEALTH INSURANCE | Source: Ambulatory Visit | Attending: Surgery | Admitting: Surgery

## 2017-08-05 ENCOUNTER — Encounter: Payer: Self-pay | Admitting: Surgery

## 2017-08-05 ENCOUNTER — Ambulatory Visit (INDEPENDENT_AMBULATORY_CARE_PROVIDER_SITE_OTHER): Payer: PRIVATE HEALTH INSURANCE | Admitting: Surgery

## 2017-08-05 ENCOUNTER — Other Ambulatory Visit: Payer: Self-pay

## 2017-08-05 ENCOUNTER — Telehealth: Payer: Self-pay

## 2017-08-05 DIAGNOSIS — N281 Cyst of kidney, acquired: Secondary | ICD-10-CM | POA: Insufficient documentation

## 2017-08-05 DIAGNOSIS — K5792 Diverticulitis of intestine, part unspecified, without perforation or abscess without bleeding: Secondary | ICD-10-CM

## 2017-08-05 DIAGNOSIS — K5730 Diverticulosis of large intestine without perforation or abscess without bleeding: Secondary | ICD-10-CM | POA: Diagnosis not present

## 2017-08-05 DIAGNOSIS — N200 Calculus of kidney: Secondary | ICD-10-CM | POA: Diagnosis not present

## 2017-08-05 MED ORDER — IOPAMIDOL (ISOVUE-300) INJECTION 61%
100.0000 mL | Freq: Once | INTRAVENOUS | Status: AC | PRN
  Administered 2017-08-05: 100 mL via INTRAVENOUS

## 2017-08-05 NOTE — Patient Instructions (Addendum)
We have scheduled you for a CT Scan of your Abdomen and Pelvis.We will contact you with the appointment time and date. You will need to arrive 15 minutes early to this appointment. If you need to reschedule your Scan, you may do so by calling [phone].  You will need to pick up a prep kit at least 24 hours in advance of your Scan: You may pick this up at the [facility] department at [facility], or [employer].  Bring a list of medications with you to your appointment and you may have nothing to eat or drink 4 hours prior to your CT Scan.   We have ordered some labs completed. These may be completed at the same day as your CT Scan. These labs will be completed at [facility]. You will check in at the registration desk in the medical mall. Please see walking directions below if needed.  We will call you with the results and next step in plan of care as soon as results are received.

## 2017-08-05 NOTE — Telephone Encounter (Signed)
Spoke with [place] at [employer] and she was able to schedule a CT Scan with contrast for the patient on 1/17 at [place] arriving at 11:15 for an 11:30 appointment. She advised that patient is due to arrive 15 minutes prior and is to be NPO 4 hours prior to appointment. She also advised that patient is due to pick up contrast solution 24 hours prior to appointment as well. I verbalized understanding and will contact the patient.   Call made to patient at this time I ad

## 2017-08-05 NOTE — Addendum Note (Signed)
Addended by: Riki Sheer on: 08/05/2017 12:56 PM   Modules accepted: Orders

## 2017-08-05 NOTE — Progress Notes (Signed)
08/05/2017  History of Present Illness: Spencer Garrett is a 62 y.o. male who presents for evaluation of abdominal pain.  He called last week on 07/31/17 reporting he was having left lower quadrant pain going towards the low pelvis, which was similar to his prior pain from diverticulitis in 2017.  He was started on oral Ciprofloxacin and Flagyl and follow-up appointment was made.  The patient reports that over the past week, he has noticed some improvement, but also some episodes where he feels somewhat worse.  Today for instance, he feels the pain is a bit worse than yesterday, but nothing as significant to require going to the ER.  He reports an episode of nausea but no emesis.  Denies having any fevers, chills, chest pain, or shortness of breath.  Does not think this is kidney stone pain as it is not the same.  He initially started a clear liquid diet, then has slowly advanced to full liquids and is now on a soft diet.  Has normal bowel movements.  Past Medical History: Past Medical History:  Diagnosis Date  . Arthritis    finger  . Cancer ([place])    skin cancer  . Family history of adverse reaction to anesthesia    mother had some complication that cancelled surgery.  (pt thinks may have had something to do with atropine, but not sure)  . GERD (gastroesophageal reflux disease)   . High cholesterol   . History of kidney stones   . Hypertension   . Hypothyroidism   . Sleep apnea    had CPAP -hasn'Spencer used recently. system not functional  . URI (upper respiratory infection)    dx'd 05/14/16. "mild". starting meds     Past Surgical History: Past Surgical History:  Procedure Laterality Date  . COLONOSCOPY WITH PROPOFOL N/A 05/20/2016   Procedure: COLONOSCOPY WITH PROPOFOL;  Surgeon: Lucilla Lame, MD;  Location: [place];  Service: Endoscopy;  Laterality: N/A;  . EXTRACORPOREAL SHOCK WAVE LITHOTRIPSY Right 10/24/2016   Procedure: EXTRACORPOREAL SHOCK WAVE LITHOTRIPSY (ESWL);  Surgeon:  Royston Cowper, MD;  Location: [facility];  Service: Urology;  Laterality: Right;  . NASAL SEPTUM SURGERY    . POLYPECTOMY  05/20/2016   Procedure: POLYPECTOMY;  Surgeon: Lucilla Lame, MD;  Location: [place];  Service: Endoscopy;;  . TONSILLECTOMY    . URETHRAL STRICTURE DILATATION    . UVULOPALATOPHARYNGOPLASTY  2000    Home Medications: Prior to Admission medications   Medication Sig Start Date End Date Taking? Authorizing Provider  ALPRAZolam Duanne Moron) 0.5 MG tablet Take 1 mg by mouth at bedtime as needed for sleep.   Yes [provider]  atorvastatin (LIPITOR) 20 MG tablet Take 20 mg by mouth at bedtime.    Yes [provider]  ciprofloxacin (CIPRO) 500 MG tablet Take 1 tablet (500 mg total) by mouth 2 (two) times daily. 07/31/17  Yes Florene Glen, MD  cyanocobalamin (,VITAMIN B-12,) 1000 MCG/ML injection Inject 1,000 mcg into the muscle every 30 (thirty) days.   Yes [provider]  hydrochlorothiazide (HYDRODIURIL) 25 MG tablet Take 25 mg by mouth daily.   Yes [provider]  levothyroxine (SYNTHROID, LEVOTHROID) 75 MCG tablet Take 75 mcg by mouth daily before breakfast.   Yes [provider]  lisinopril (PRINIVIL,ZESTRIL) 10 MG tablet Take 10 mg by mouth at bedtime.    Yes [provider]  metroNIDAZOLE (FLAGYL) 500 MG tablet Take 1 tablet (500 mg total) by mouth 3 (three)  times daily. 07/31/17  Yes Florene Glen, MD  NUCYNTA 50 MG tablet Take 1 tablet (50 mg total) by mouth every 6 (six) hours as needed for severe pain. 1 TO 2 TABS Q 6 HOURS PRN PAIN 10/24/16  Yes Royston Cowper, MD  omeprazole (PRILOSEC) 40 MG capsule Take 40 mg by mouth at bedtime.    Yes [provider]  ondansetron (ZOFRAN ODT) 8 MG disintegrating tablet Take 1 tablet (8 mg total) by mouth every 6 (six) hours as needed for nausea or vomiting. 10/24/16  Yes Royston Cowper, MD  phentermine 30 MG capsule Take 30 mg by mouth every morning.    Yes [provider]  silodosin (RAPAFLO) 8 MG CAPS capsule Take 8 mg by mouth daily. Takes at night   Yes [provider]  testosterone (ANDROGEL) 50 MG/5GM (1%) GEL Place 5 g onto the skin daily.   Yes [provider]  traZODone (DESYREL) 50 MG tablet Take 50 mg by mouth at bedtime as needed for sleep.   Yes [provider]  valACYclovir (VALTREX) 1000 MG tablet Take by mouth.   Yes [provider]    Allergies: Allergies  Allergen Reactions  . Oxycodone Itching    Can take with benadryl if he needs to use this medicine.  . Pineapple Hives and Rash    Social History:  reports that  has never smoked. he has never used smokeless tobacco. He reports that he drinks about 15.0 oz of alcohol per week. He reports that he does not use drugs.   Family History: Family History  Problem Relation Age of Onset  . Lung cancer Mother   . Hypertension Mother   . Emphysema Father   . Diabetes Sister   . Heart disease Maternal Grandfather     Review of Systems: Review of Systems  Constitutional: Negative for chills and fever.  Respiratory: Negative for shortness of breath.   Cardiovascular: Negative for chest pain.  Gastrointestinal: Positive for abdominal pain and nausea. Negative for constipation, diarrhea and vomiting.  Genitourinary: Negative for dysuria.    Physical Exam BP (!) 150/87   Pulse 89   Temp 98.3 F (36.8 C) (Oral)   Ht 5\' 9"  (1.753 m)   Wt 113.9 kg (251 lb)   BMI 37.07 kg/m  CONSTITUTIONAL: No acute distress RESPIRATORY:  Lungs are clear, and breath sounds are equal bilaterally. Normal respiratory effort without pathologic use of accessory muscles. CARDIOVASCULAR: Heart is regular without murmurs, gallops, or rubs. GI: The abdomen is soft, obese, non-distended, with only some discomfort to palpation over the left lower quadrant and low pelvis.  No peritoneal signs.  MUSCULOSKELETAL:  Normal muscle strength and tone in all  four extremities.  No peripheral edema or cyanosis. NEUROLOGIC:  Motor and sensation is grossly normal.  Cranial nerves are grossly intact. PSYCH:  Alert and oriented to person, place and time. Affect is normal.  Labs/Imaging: None recently.  Assessment and Plan: This is a 62 y.o. male who presents with abdominal pain, likely acute diverticulitis.  Discussed with the patient that I would expect some more improvement if the episode of diverticulitis were very mild.  With some improvement and worsening, discussed with the patient that it may be worthwhile obtaining labs and a CT scan to better assess.  He is in agreement.  Will order CBC, CMP, and CT scan abd/pelvis with contrast.  Will call patient with results.  Patient is aware that he may need to come  to hospital for IV antibiotics if there's anything worse on CT or he may simply require a change in oral antibiotics.  At this point I would be surprised if he would require urgent surgery based on his presentation and being non-toxic today.  The patient did ask about surgical intervention in the future.  At this point 1-2 episodes of mild diverticulitis, discussed with patient that there is no urgency to pursue sigmoidectomy and would be more of a patient preference and judging risks and benefits.  Discussed that first we would need to get through this episode and then we can talk more about any surgical interventions and work-up.  The patient understands this plan and all of his questions have been answered.  Face-to-face time spent with the patient and care providers was 40 minutes, with more than 50% of the time spent counseling, educating, and coordinating care of the patient.     Melvyn Neth, [place]

## 2017-08-06 NOTE — Telephone Encounter (Signed)
Patient called asking if his CT results have came in. Please call patient and advise.

## 2017-08-06 NOTE — Telephone Encounter (Signed)
Dr Hampton Abbot reviewed results of the CT scan. I have informed the patient, per Dr Hampton Abbot, that his CT scan had significantly improved from his last CT scan last year. The patient was advised to finish his antibiotics.   Patient stated that he is having 3-4 loose bowel movements daily, no blood in stool. Some abdominal pressure. Some abdominal pain-pain level 3.  I have went over these symptoms with Dr Hampton Abbot. He stated to advise the patient given his CT scan that he does not feel that these symptoms are from diverticulitis. He recommended a H. Pylori test to rule H. Pylori out. Patient declined the test and stated that he would like to follow up with Urology due to past medical history. Dr Hampton Abbot also stated that patient could take ibuprofren for the pain. Patient was ok with this advice. I have advised the patient to call if pain does not improve and if any other symptoms continue to get worse. Patient was ok with all information.   Patient will follow up in office PRN.

## 2017-08-14 ENCOUNTER — Ambulatory Visit: Payer: Managed Care, Other (non HMO)

## 2017-09-12 DIAGNOSIS — E669 Obesity, unspecified: Secondary | ICD-10-CM | POA: Insufficient documentation

## 2018-10-14 ENCOUNTER — Other Ambulatory Visit: Payer: Self-pay

## 2018-10-14 ENCOUNTER — Ambulatory Visit: Payer: PRIVATE HEALTH INSURANCE | Admitting: Urology

## 2018-10-14 ENCOUNTER — Encounter: Payer: Self-pay | Admitting: Urology

## 2018-10-14 VITALS — BP 146/83 | HR 99 | Ht 69.0 in | Wt 254.0 lb

## 2018-10-14 DIAGNOSIS — R300 Dysuria: Secondary | ICD-10-CM

## 2018-10-14 DIAGNOSIS — R350 Frequency of micturition: Secondary | ICD-10-CM | POA: Diagnosis not present

## 2018-10-14 DIAGNOSIS — Z87448 Personal history of other diseases of urinary system: Secondary | ICD-10-CM | POA: Diagnosis not present

## 2018-10-14 DIAGNOSIS — N35914 Unspecified anterior urethral stricture, male: Secondary | ICD-10-CM | POA: Diagnosis not present

## 2018-10-14 DIAGNOSIS — E785 Hyperlipidemia, unspecified: Secondary | ICD-10-CM

## 2018-10-14 DIAGNOSIS — R3915 Urgency of urination: Secondary | ICD-10-CM

## 2018-10-14 DIAGNOSIS — E1129 Type 2 diabetes mellitus with other diabetic kidney complication: Secondary | ICD-10-CM | POA: Insufficient documentation

## 2018-10-14 DIAGNOSIS — R809 Proteinuria, unspecified: Secondary | ICD-10-CM

## 2018-10-14 DIAGNOSIS — E1169 Type 2 diabetes mellitus with other specified complication: Secondary | ICD-10-CM | POA: Insufficient documentation

## 2018-10-14 LAB — BLADDER SCAN AMB NON-IMAGING

## 2018-10-15 LAB — URINALYSIS, COMPLETE
Bilirubin, UA: NEGATIVE
GLUCOSE, UA: NEGATIVE
Ketones, UA: NEGATIVE
Nitrite, UA: NEGATIVE
Specific Gravity, UA: 1.025 (ref 1.005–1.030)
UUROB: 0.2 mg/dL (ref 0.2–1.0)
pH, UA: 5.5 (ref 5.0–7.5)

## 2018-10-15 LAB — MICROSCOPIC EXAMINATION

## 2018-10-16 ENCOUNTER — Encounter: Payer: Self-pay | Admitting: Urology

## 2018-10-16 LAB — CULTURE, URINE COMPREHENSIVE

## 2018-10-16 NOTE — Progress Notes (Signed)
10/14/2018 1:38 PM   T Gene Radney Dec 30, 1955 [id number]  Referring provider: Leonel Ramsay, MD [address] [place], [place] [address]  Chief Complaint  Patient presents with  . Other    HPI: 63 year old male presents today for a second opinion regarding lower urinary tract symptoms.  He states he has been followed by Dr. Yves Dill for approximately 30 years.  He has a history of recurrent stone disease and recurrent penile urethral stricture.  He states he has had multiple dilations in the past and underwent at least one internal urethrotomy in 2013.  He states he has never been on self dilation but has periodic urethral sounding in the office.  Dr. Letta Kocher records were not available for review.  He recently developed bothersome urinary frequency, urgency with urge incontinence.  He has failed multiple medications including silodosin, Myrbetriq and Toviaz.  He had no improvement in his symptoms.  He subsequently underwent UroLift in August 2019 and had no improvement in his storage related symptoms.  He states his last anoscopy was approximately 4-6 weeks ago when he underwent urethral dilation at that time.  His symptoms have persisted and are bothersome.  IPSS completed today was 12/4 with his most bothersome symptoms being urinary frequency and urgency.  Denies dysuria or gross hematuria.  Denies flank, abdominal, pelvic or scrotal pain.   PMH: Past Medical History:  Diagnosis Date  . Arthritis    finger  . Cancer ([facility])    skin cancer  . Family history of adverse reaction to anesthesia    mother had some complication that cancelled surgery.  (pt thinks may have had something to do with atropine, but not sure)  . GERD (gastroesophageal reflux disease)   . High cholesterol   . History of kidney stones   . Hypertension   . Hypothyroidism   . Sleep apnea    had CPAP -hasn't used recently. system not functional  . URI (upper respiratory infection)    dx'd  05/14/16. "mild". starting meds    Surgical History: Past Surgical History:  Procedure Laterality Date  . COLONOSCOPY WITH PROPOFOL N/A 05/20/2016   Procedure: COLONOSCOPY WITH PROPOFOL;  Surgeon: Lucilla Lame, MD;  Location: [facility];  Service: Endoscopy;  Laterality: N/A;  . EXTRACORPOREAL SHOCK WAVE LITHOTRIPSY Right 10/24/2016   Procedure: EXTRACORPOREAL SHOCK WAVE LITHOTRIPSY (ESWL);  Surgeon: Royston Cowper, MD;  Location: [facility];  Service: Urology;  Laterality: Right;  . NASAL SEPTUM SURGERY    . POLYPECTOMY  05/20/2016   Procedure: POLYPECTOMY;  Surgeon: Lucilla Lame, MD;  Location: [facility];  Service: Endoscopy;;  . TONSILLECTOMY    . URETHRAL STRICTURE DILATATION    . UVULOPALATOPHARYNGOPLASTY  2000    Home Medications:  Allergies as of 10/14/2018      Reactions   Oxycodone Itching   Can take with benadryl if he needs to use this medicine.   Pineapple Hives, Rash      Medication List       Accurate as of October 14, 2018 11:59 PM. Always use your most recent med list.        ALPRAZolam 0.5 MG tablet Commonly known as:  XANAX Take 1 mg by mouth at bedtime as needed for sleep.   atorvastatin 20 MG tablet Commonly known as:  LIPITOR Take 20 mg by mouth at bedtime.   cyanocobalamin 1000 MCG/ML injection Commonly known as:  (VITAMIN B-12) Inject 1,000 mcg into the muscle every 30 (thirty) days.  hydrochlorothiazide 25 MG tablet Commonly known as:  HYDRODIURIL Take 25 mg by mouth daily.   levothyroxine 75 MCG tablet Commonly known as:  SYNTHROID, LEVOTHROID Take 75 mcg by mouth daily before breakfast.   lisinopril 10 MG tablet Commonly known as:  PRINIVIL,ZESTRIL Take 10 mg by mouth at bedtime.   meloxicam 15 MG tablet Commonly known as:  MOBIC Take by mouth.   metFORMIN 500 MG 24 hr tablet Commonly known as:  GLUCOPHAGE-XR Take by mouth.   metroNIDAZOLE 500 MG tablet Commonly known as:  Flagyl Take 1 tablet (500 mg total) by  mouth 3 (three) times daily.   Nucynta 50 MG tablet Generic drug:  tapentadol Take 1 tablet (50 mg total) by mouth every 6 (six) hours as needed for severe pain. 1 TO 2 TABS Q 6 HOURS PRN PAIN   omeprazole 40 MG capsule Commonly known as:  PRILOSEC Take 40 mg by mouth at bedtime.   ondansetron 8 MG disintegrating tablet Commonly known as:  Zofran ODT Take 1 tablet (8 mg total) by mouth every 6 (six) hours as needed for nausea or vomiting.   phentermine 30 MG capsule Take 30 mg by mouth every morning.   testosterone 50 MG/5GM (1%) Gel Commonly known as:  ANDROGEL Place 5 g onto the skin daily.   traZODone 50 MG tablet Commonly known as:  DESYREL Take 50 mg by mouth at bedtime as needed for sleep.   valACYclovir 1000 MG tablet Commonly known as:  VALTREX Take by mouth.       Allergies:  Allergies  Allergen Reactions  . Oxycodone Itching    Can take with benadryl if he needs to use this medicine.  . Pineapple Hives and Rash    Family History: Family History  Problem Relation Age of Onset  . Lung cancer Mother   . Hypertension Mother   . Emphysema Father   . Diabetes Sister   . Heart disease Maternal Grandfather     Social History:  reports that he has never smoked. He has never used smokeless tobacco. He reports current alcohol use of about 25.0 standard drinks of alcohol per week. He reports that he does not use drugs.  ROS: UROLOGY Frequent Urination?: Yes Hard to postpone urination?: Yes Burning/pain with urination?: No Get up at night to urinate?: No Leakage of urine?: Yes Urine stream starts and stops?: No Trouble starting stream?: No Do you have to strain to urinate?: No Blood in urine?: No Urinary tract infection?: No Sexually transmitted disease?: No Injury to kidneys or bladder?: No Painful intercourse?: No Weak stream?: No Erection problems?: Yes Penile pain?: Yes  Gastrointestinal Nausea?: No Vomiting?: No Indigestion/heartburn?: No  Diarrhea?: No Constipation?: No  Constitutional Fever: No Night sweats?: No Weight loss?: No Fatigue?: No  Skin Skin rash/lesions?: No Itching?: No  Eyes Blurred vision?: No Double vision?: No  Ears/Nose/Throat Sore throat?: No Sinus problems?: No  Hematologic/Lymphatic Swollen glands?: No Easy bruising?: Yes  Cardiovascular Leg swelling?: No Chest pain?: No  Respiratory Cough?: No Shortness of breath?: No  Endocrine Excessive thirst?: No  Musculoskeletal Back pain?: No Joint pain?: No  Neurological Headaches?: No Dizziness?: No  Psychologic Depression?: No Anxiety?: No  Physical Exam: BP (!) 146/83 (BP Location: Left Arm, Patient Position: Sitting, Cuff Size: Large)   Pulse 99   Ht 5\' 9"  (1.753 m)   Wt 254 lb (115.2 kg)   BMI 37.51 kg/m   Constitutional:  Alert and oriented, No acute distress. Skin: No rashes,  bruises or suspicious lesions. Neurologic: Grossly intact, no focal deficits, moving all 4 extremities. Psychiatric: Normal mood and affect.  Laboratory Data:  Urinalysis Microscopy 11-30 WBCs.  Assessment & Plan:   63 year old male with bothersome storage related voiding symptoms.  He has a history of BPH status post UroLift and recurrent urethral stricture disease.  He has failed medical management.  He has bladder overactivity which may be idiopathic.  The possibility of obstruction secondary to his stricture is a potential etiology.  Options were discussed including a video urodynamic study to assess for overactivity with or without obstruction.  I also discussed starting intermittent self dilation with a 14 French catheter to see if this would improve his voiding symptoms.  He states he will follow-up with Dr. Yves Dill to discuss with him.  If he desires to transfer care would recommend scheduling cystoscopy and teaching intermittent urethral self dilation  PVR by bladder scan was 17 mL   Abbie Sons, MD  [facility] [address] [place], [place] [address] [phone]

## 2018-10-18 ENCOUNTER — Encounter: Payer: Self-pay | Admitting: Urology

## 2018-10-18 ENCOUNTER — Other Ambulatory Visit: Payer: Self-pay | Admitting: Urology

## 2018-10-18 MED ORDER — SULFAMETHOXAZOLE-TRIMETHOPRIM 800-160 MG PO TABS: 1.0000 | ORAL_TABLET | Freq: Two times a day (BID) | ORAL | 0 refills | Status: AC

## 2019-07-08 ENCOUNTER — Other Ambulatory Visit: Payer: Self-pay

## 2019-07-08 DIAGNOSIS — Z20822 Contact with and (suspected) exposure to covid-19: Secondary | ICD-10-CM

## 2019-07-10 LAB — NOVEL CORONAVIRUS, NAA: SARS-CoV-2, NAA: NOT DETECTED

## 2019-11-24 ENCOUNTER — Other Ambulatory Visit: Payer: Self-pay | Admitting: Orthopedic Surgery

## 2019-11-24 DIAGNOSIS — M5416 Radiculopathy, lumbar region: Secondary | ICD-10-CM

## 2019-11-24 DIAGNOSIS — G8929 Other chronic pain: Secondary | ICD-10-CM

## 2019-12-05 ENCOUNTER — Ambulatory Visit
Admission: RE | Admit: 2019-12-05 | Discharge: 2019-12-05 | Disposition: A | Discharge status: Home / Self Care | Payer: PRIVATE HEALTH INSURANCE | Source: Ambulatory Visit | Attending: Orthopedic Surgery | Admitting: Orthopedic Surgery

## 2019-12-05 DIAGNOSIS — G8929 Other chronic pain: Secondary | ICD-10-CM | POA: Diagnosis present

## 2019-12-05 DIAGNOSIS — M5441 Lumbago with sciatica, right side: Secondary | ICD-10-CM | POA: Diagnosis present

## 2019-12-05 DIAGNOSIS — M5416 Radiculopathy, lumbar region: Secondary | ICD-10-CM | POA: Diagnosis present

## 2020-10-03 ENCOUNTER — Other Ambulatory Visit: Payer: Self-pay | Admitting: Internal Medicine

## 2020-10-03 DIAGNOSIS — N63 Unspecified lump in unspecified breast: Secondary | ICD-10-CM

## 2020-10-04 ENCOUNTER — Ambulatory Visit
Admission: RE | Admit: 2020-10-04 | Discharge: 2020-10-04 | Disposition: A | Discharge status: Home / Self Care | Payer: PRIVATE HEALTH INSURANCE | Source: Ambulatory Visit | Attending: Internal Medicine | Admitting: Internal Medicine

## 2020-10-04 ENCOUNTER — Other Ambulatory Visit: Payer: Self-pay

## 2020-10-04 DIAGNOSIS — N63 Unspecified lump in unspecified breast: Secondary | ICD-10-CM

## 2020-10-09 ENCOUNTER — Other Ambulatory Visit: Payer: Self-pay | Admitting: Internal Medicine

## 2020-10-09 DIAGNOSIS — N6320 Unspecified lump in the left breast, unspecified quadrant: Secondary | ICD-10-CM

## 2020-10-09 DIAGNOSIS — R928 Other abnormal and inconclusive findings on diagnostic imaging of breast: Secondary | ICD-10-CM

## 2020-10-23 ENCOUNTER — Other Ambulatory Visit: Payer: Self-pay

## 2020-10-23 ENCOUNTER — Ambulatory Visit
Admission: RE | Admit: 2020-10-23 | Discharge: 2020-10-23 | Disposition: A | Discharge status: Home / Self Care | Payer: PRIVATE HEALTH INSURANCE | Source: Ambulatory Visit | Attending: Internal Medicine | Admitting: Internal Medicine

## 2020-10-23 DIAGNOSIS — R928 Other abnormal and inconclusive findings on diagnostic imaging of breast: Secondary | ICD-10-CM

## 2020-10-23 DIAGNOSIS — N6320 Unspecified lump in the left breast, unspecified quadrant: Secondary | ICD-10-CM

## 2020-10-23 HISTORY — PX: BREAST BIOPSY: SHX20

## 2020-10-26 LAB — SURGICAL PATHOLOGY

## 2020-10-27 ENCOUNTER — Encounter: Payer: Self-pay | Admitting: *Deleted

## 2020-10-27 NOTE — Progress Notes (Signed)
Notified by Electa Sniff, RN that patient had a benign breast biopsy, but needs surgical consult.  Called patient and he would like to see Dr. Bary Castilla.  Called Dr. Dwyane Luo office.  Unable to schedule patient today.  Dr. Bary Castilla and his nurse are both out of the office today.  It was recommended that I call back on Monday.  Informed patient.  Will schedule consult on Monday.

## 2020-10-30 ENCOUNTER — Encounter: Payer: Self-pay | Admitting: *Deleted

## 2020-10-30 NOTE — Progress Notes (Signed)
Let patient a message with his appointment to see Dr. Bary Castilla on 11/06/20 @ 3:30.

## 2020-11-06 ENCOUNTER — Other Ambulatory Visit: Payer: Self-pay | Admitting: General Surgery

## 2020-11-09 LAB — SURGICAL PATHOLOGY

## 2022-08-12 IMAGING — US US BREAST*L* LIMITED INC AXILLA
1 series · 11 of 11 positions shown · non-contrast
Comparison: None.

ACR Breast Density Category a: The breast tissue is almost entirely
fatty.

CLINICAL DATA: Palpable bilateral areas.

EXAM:
DIGITAL DIAGNOSTIC BILATERAL MAMMOGRAM WITH TOMOSYNTHESIS AND CAD;
ULTRASOUND LEFT BREAST LIMITED; ULTRASOUND RIGHT BREAST LIMITED
TECHNIQUE: Bilateral digital diagnostic mammography and breast tomosynthesis
was performed. The images were evaluated with computer-aided
detection.; Targeted ultrasound examination of the left breast was
performed; Targeted ultrasound examination of the right breast was
performed

[Series 1: us breast*left* limited inc axilla · 0.04mm/px · 11 of 11 slices shown]
[im 1/11]
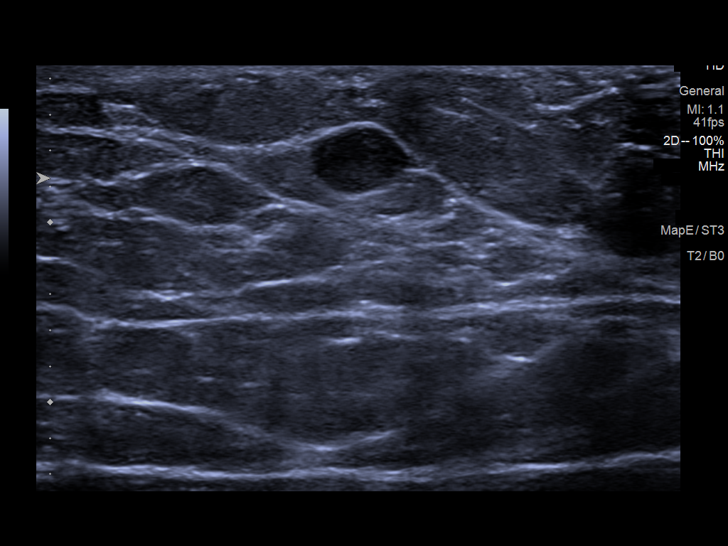
[im 2/11]
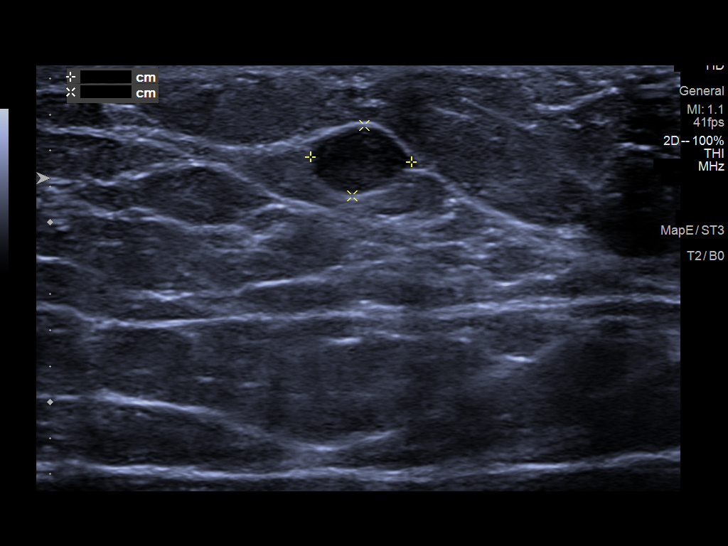
[im 3/11]
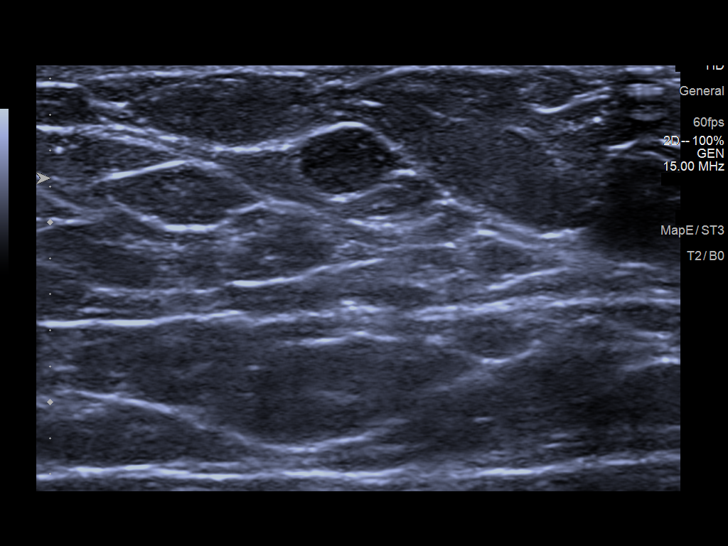
[im 4/11]
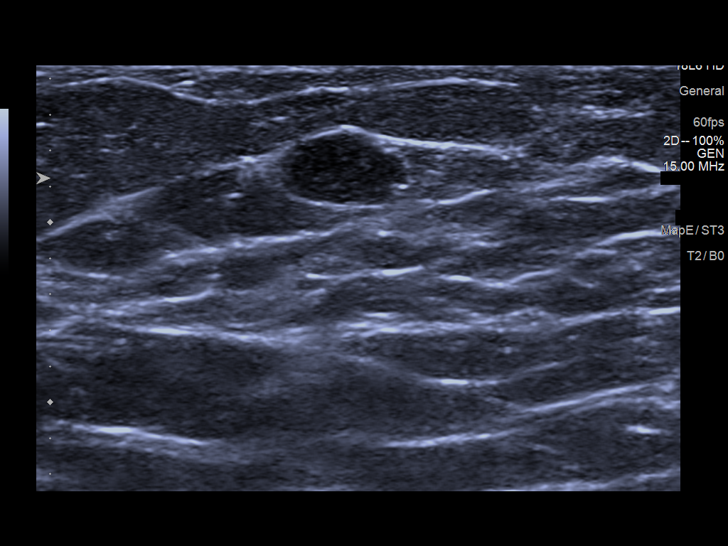
[im 5/11]
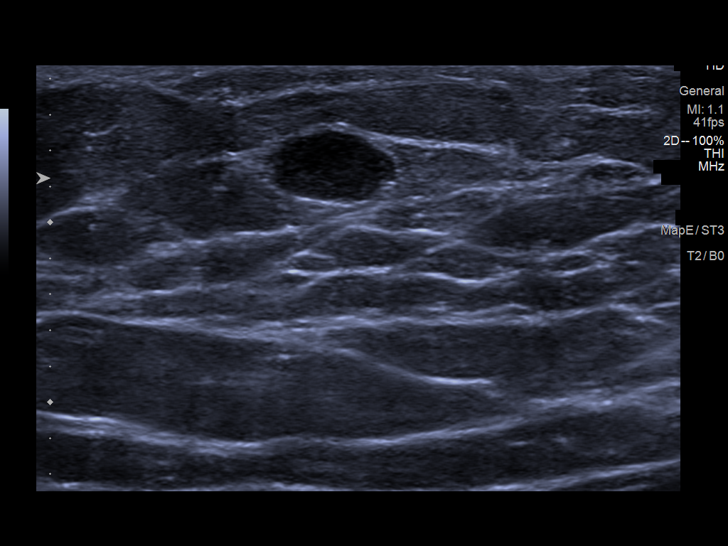
[im 6/11]
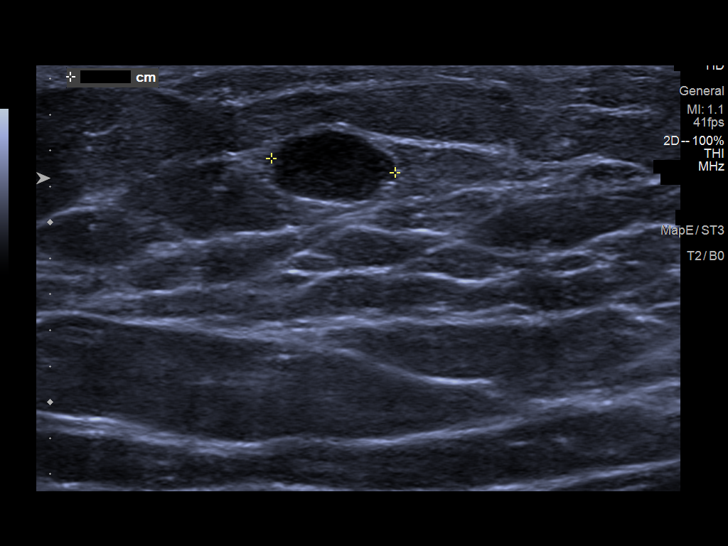
[im 7/11]
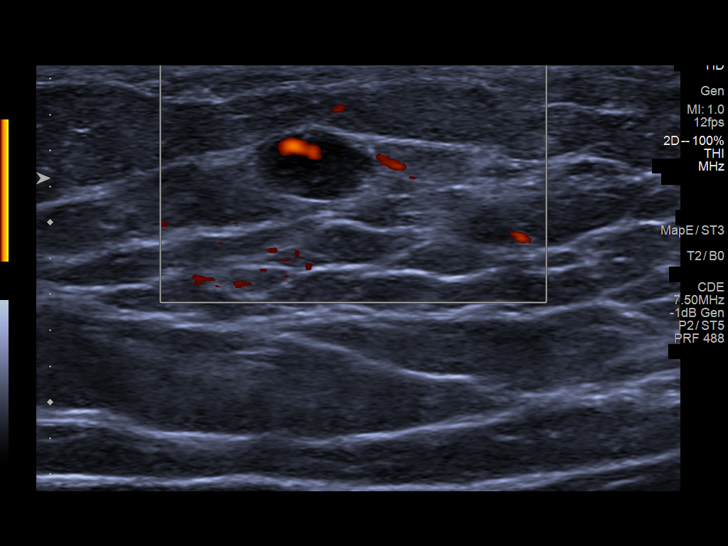
[im 8/11]
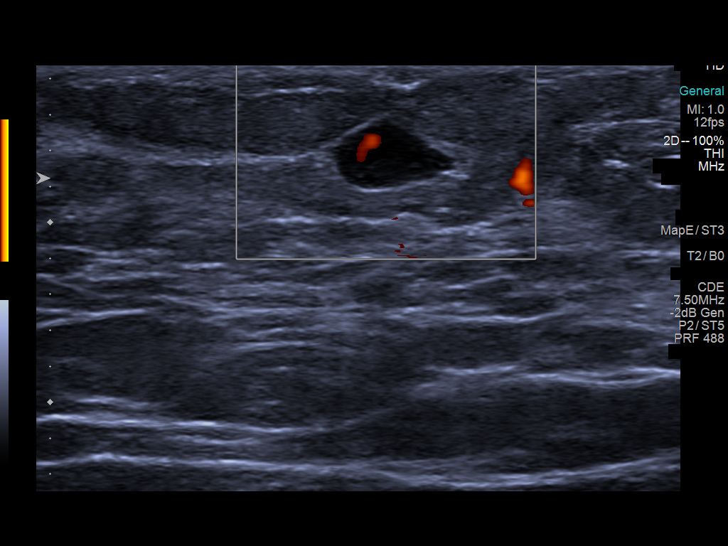
[im 9/11]
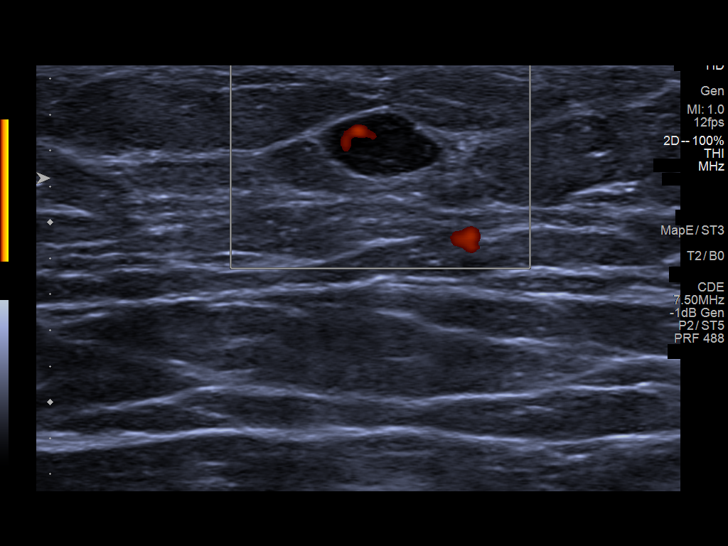
[im 10/11]
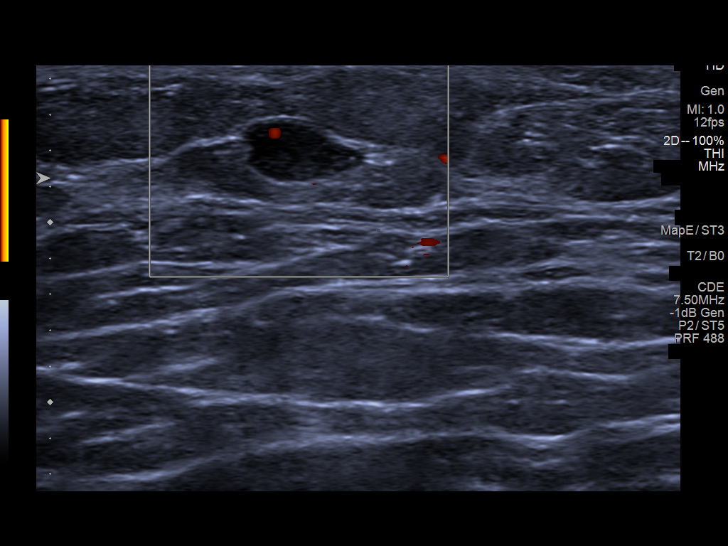
[im 11/11]
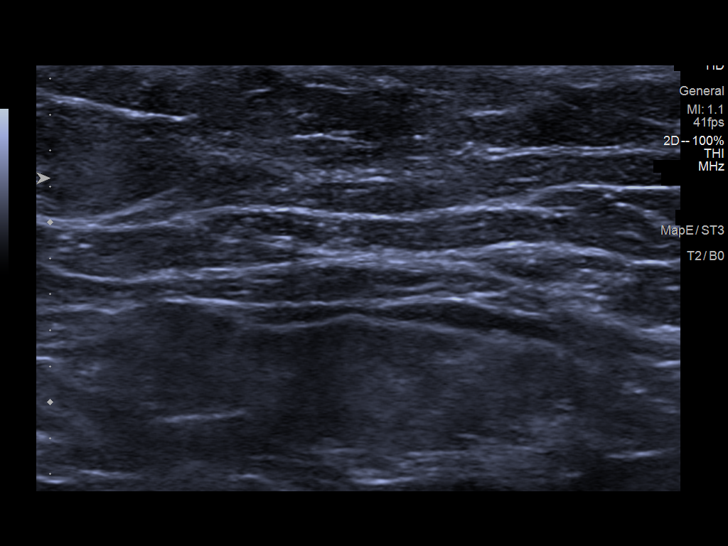

[11 of 11 positions shown; findings below may reference images not displayed]

FINDINGS: Spot compression was obtained over the palpable area of concern in
the RIGHT breast. No suspicious mammographic finding is identified
in this area.

Spot compression was obtained over the palpable area of concern in
the LEFT breast. There is an oval circumscribed 6 mm mass near the
site of palpable concern. No additional suspicious mammographic
finding is identified in this area.

No suspicious mass, microcalcification, or other finding is
identified in either breast.

Mammographic images were processed with CAD.

Targeted RIGHT breast ultrasound was performed in the palpable area
of concern at the outer breast. No suspicious solid or cystic mass
is identified.

Targeted LEFT breast ultrasound is performed of the site of palpable
concern. There is an oval circumscribed hypoechoic mass with
internal blood flow at 2 o'clock 3 cm from the nipple. It measures 7
by 6 x 4 mm. No definitive echogenic hila is noted.

Targeted ultrasound was performed of the LEFT axilla. No suspicious
axillary lymph nodes are seen.
IMPRESSION: 1. In the LEFT breast, there is a 7 mm oval circumscribed mass near
the site of palpable concern. While this may reflect an intramammary
lymph node, no definitive echogenic hila is noted. Recommend
ultrasound-guided biopsy for definitive characterization.
2. No mammographic or sonographic evidence of malignancy at the site
of palpable concern in the RIGHT breast.
3. No LEFT axillary adenopathy.

RECOMMENDATION:
LEFT breast ultrasound-guided biopsy x1

I have discussed the findings and recommendations with the patient.
If applicable, a reminder letter will be sent to the patient
regarding the next appointment. Patient will be scheduled for biopsy
at her earliest convenience by the schedulers and ordering provider
will be notified.

BI-RADS CATEGORY  4: Suspicious.

## 2022-08-12 IMAGING — MG DIGITAL DIAGNOSTIC BILAT W/ TOMO W/ CAD
6 of 12 series · 6 of 36 positions shown · non-contrast
Comparison: None.

ACR Breast Density Category a: The breast tissue is almost entirely
fatty.

CLINICAL DATA: Palpable bilateral areas.

EXAM:
DIGITAL DIAGNOSTIC BILATERAL MAMMOGRAM WITH TOMOSYNTHESIS AND CAD;
ULTRASOUND LEFT BREAST LIMITED; ULTRASOUND RIGHT BREAST LIMITED
TECHNIQUE: Bilateral digital diagnostic mammography and breast tomosynthesis
was performed. The images were evaluated with computer-aided
detection.; Targeted ultrasound examination of the left breast was
performed; Targeted ultrasound examination of the right breast was
performed

[L CC synth-2D]
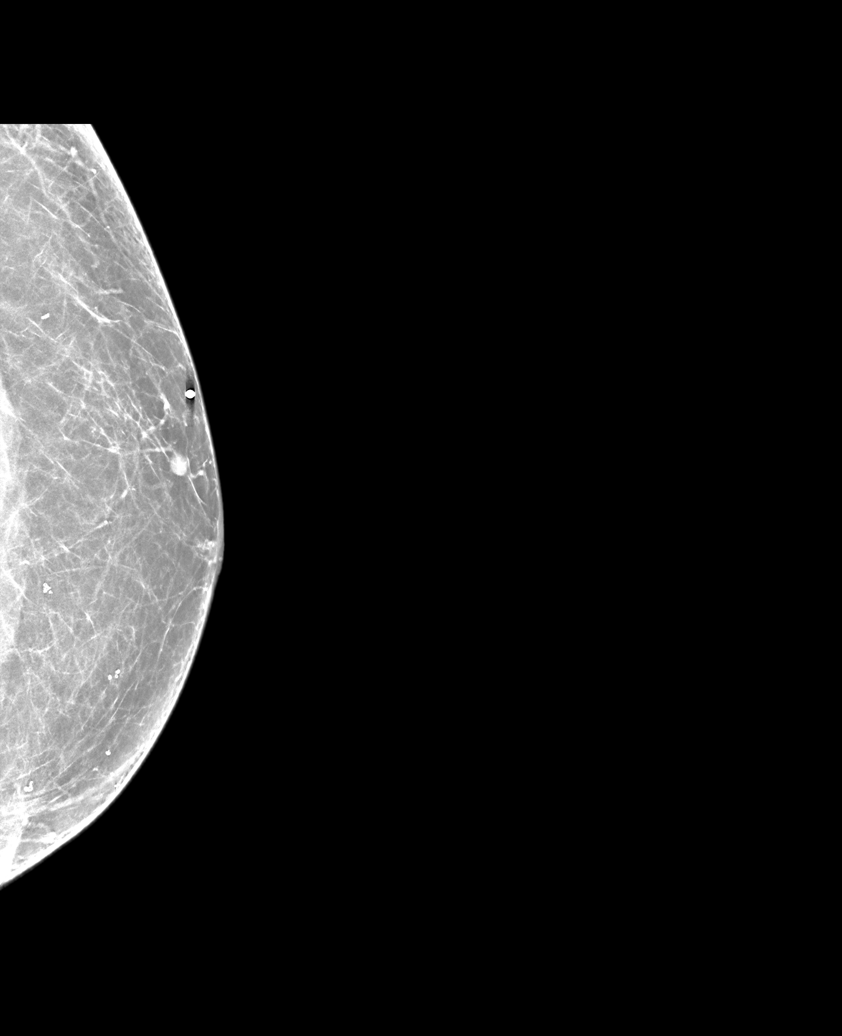

[R TAN synth-2D]
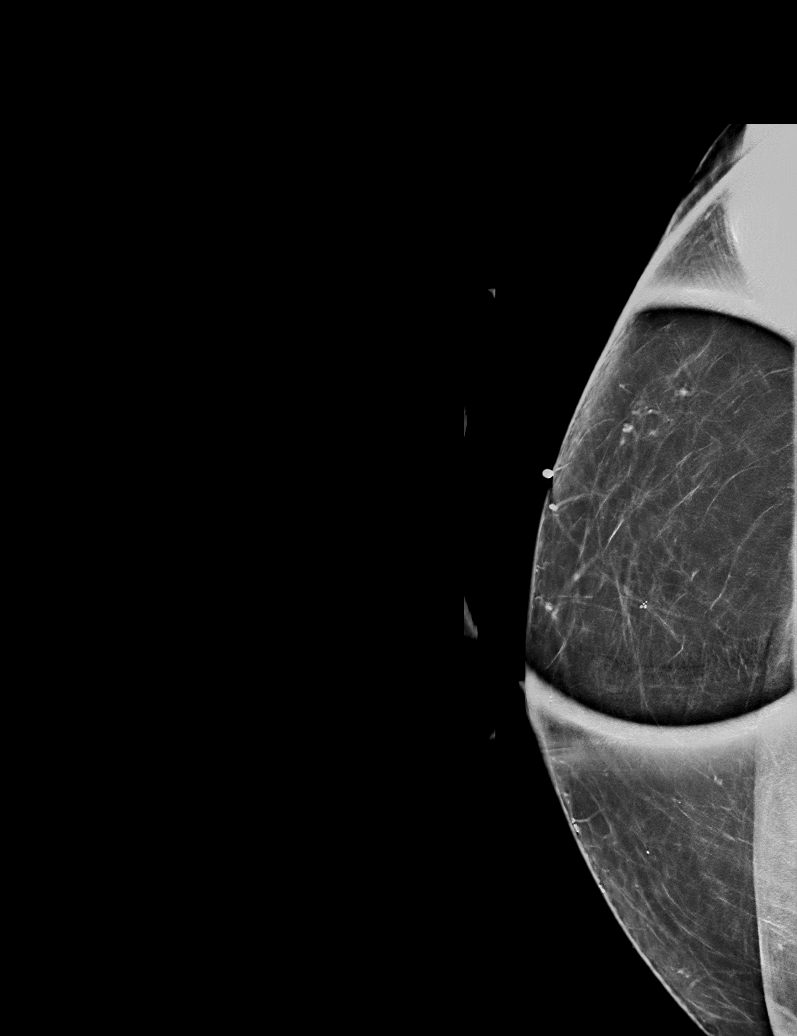

[L TAN synth-2D]
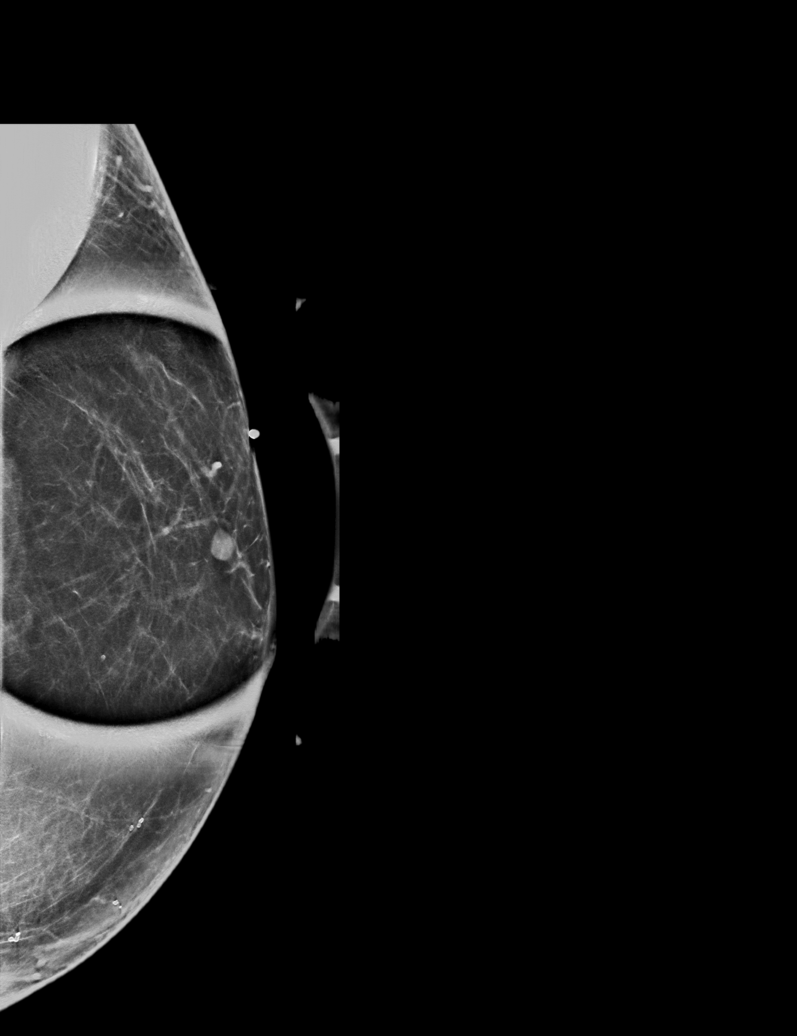

[R MLO synth-2D]
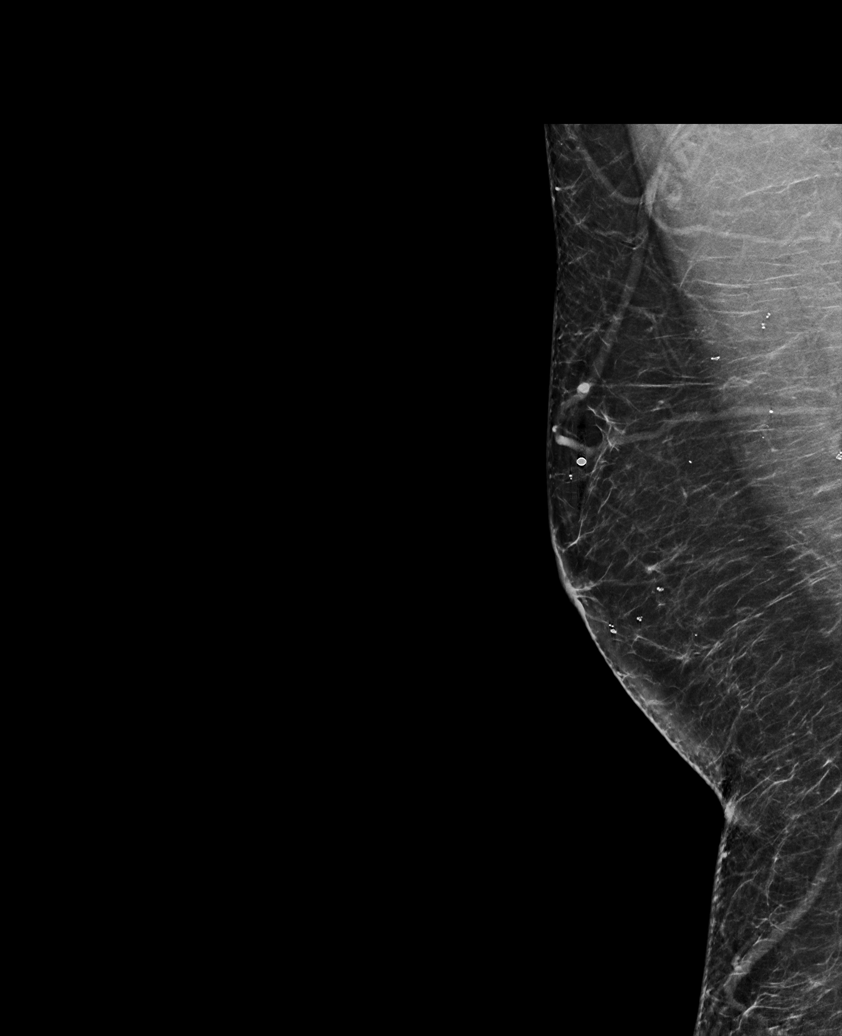

[R CC synth-2D]
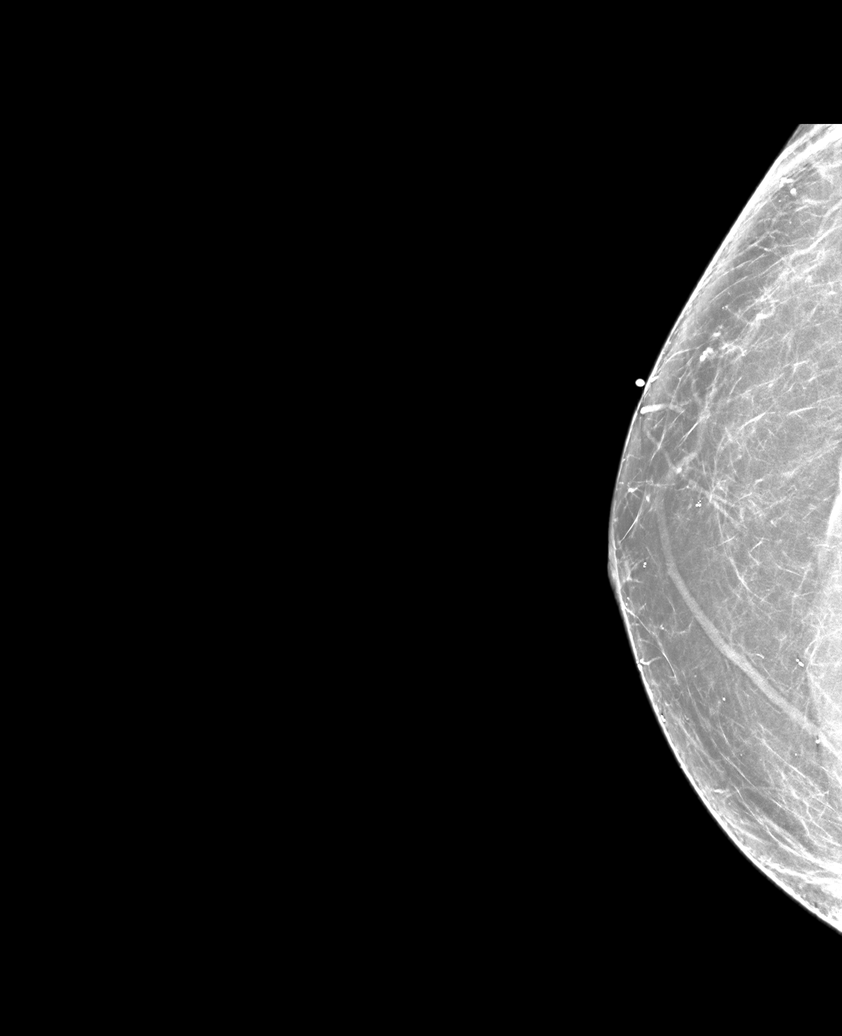

[L MLO synth-2D]
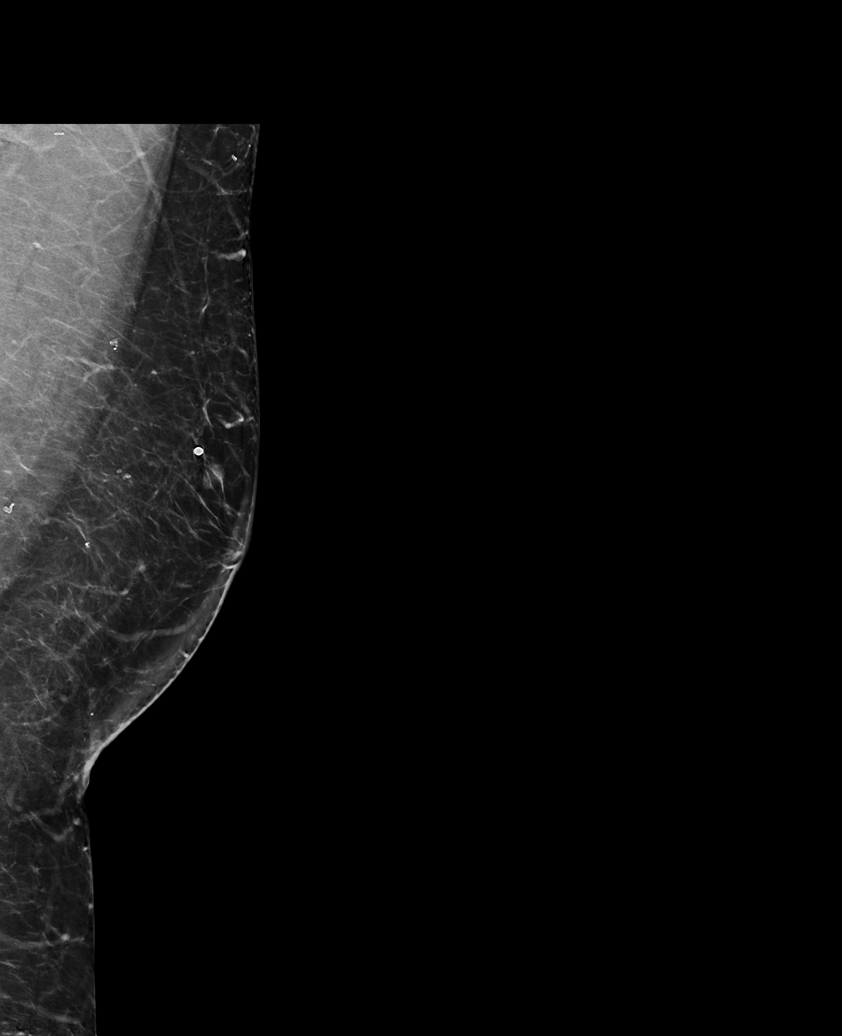

[6 of 36 positions shown; findings below may reference images not displayed]

FINDINGS: Spot compression was obtained over the palpable area of concern in
the RIGHT breast. No suspicious mammographic finding is identified
in this area.

Spot compression was obtained over the palpable area of concern in
the LEFT breast. There is an oval circumscribed 6 mm mass near the
site of palpable concern. No additional suspicious mammographic
finding is identified in this area.

No suspicious mass, microcalcification, or other finding is
identified in either breast.

Mammographic images were processed with CAD.

Targeted RIGHT breast ultrasound was performed in the palpable area
of concern at the outer breast. No suspicious solid or cystic mass
is identified.

Targeted LEFT breast ultrasound is performed of the site of palpable
concern. There is an oval circumscribed hypoechoic mass with
internal blood flow at 2 o'clock 3 cm from the nipple. It measures 7
by 6 x 4 mm. No definitive echogenic hila is noted.

Targeted ultrasound was performed of the LEFT axilla. No suspicious
axillary lymph nodes are seen.
IMPRESSION: 1. In the LEFT breast, there is a 7 mm oval circumscribed mass near
the site of palpable concern. While this may reflect an intramammary
lymph node, no definitive echogenic hila is noted. Recommend
ultrasound-guided biopsy for definitive characterization.
2. No mammographic or sonographic evidence of malignancy at the site
of palpable concern in the RIGHT breast.
3. No LEFT axillary adenopathy.

RECOMMENDATION:
LEFT breast ultrasound-guided biopsy x1

I have discussed the findings and recommendations with the patient.
If applicable, a reminder letter will be sent to the patient
regarding the next appointment. Patient will be scheduled for biopsy
at her earliest convenience by the schedulers and ordering provider
will be notified.

BI-RADS CATEGORY  4: Suspicious.

## 2024-08-24 ENCOUNTER — Encounter: Payer: Self-pay | Admitting: Urology

## 2024-08-24 ENCOUNTER — Ambulatory Visit: Payer: PRIVATE HEALTH INSURANCE | Admitting: Urology

## 2024-08-24 VITALS — BP 125/81 | HR 77 | Ht 69.0 in | Wt 195.0 lb

## 2024-08-24 DIAGNOSIS — Z87448 Personal history of other diseases of urinary system: Secondary | ICD-10-CM

## 2024-08-24 DIAGNOSIS — E291 Testicular hypofunction: Secondary | ICD-10-CM

## 2024-08-24 DIAGNOSIS — N401 Enlarged prostate with lower urinary tract symptoms: Secondary | ICD-10-CM

## 2024-08-24 LAB — MICROSCOPIC EXAMINATION

## 2024-08-24 LAB — URINALYSIS, COMPLETE
Bilirubin, UA: NEGATIVE
Glucose, UA: NEGATIVE
Ketones, UA: NEGATIVE
Leukocytes,UA: NEGATIVE
Nitrite, UA: NEGATIVE
Protein,UA: NEGATIVE
Specific Gravity, UA: 1.02 (ref 1.005–1.030)
Urobilinogen, Ur: 1 mg/dL (ref 0.2–1.0)
pH, UA: 6 (ref 5.0–7.5)

## 2024-08-24 NOTE — Progress Notes (Signed)
 "  08/24/2024 10:45 AM   [facility] [birth year] [id number]  Referring provider: Rudolpho Norleen BIRCH, MD [address] [facility] [address] [facility] [place],  [place] [address]  Chief Complaint  Patient presents with   Nephrolithiasis    HPI: [facility] is a 69 y.o. male presents to establish local urologic care.  Previously followed by Dr. Kassie for BPH, recurrent nephrolithiasis, urethral stricture and hypogonadism PCP currently prescribing IM testosterone 200 mg every 2 weeks.  He states he has not had a testosterone level checked in several months.  His last injection was approximately 1 month ago Seen [facility] acute care 07/27/2024 complaining of urinary urgency, right low back pain and dysuria which she thought was more related to previous prostatitis symptoms however states he was told his symptoms were suspicious for a stone.  Urinalysis was unremarkable.  He was prescribed empiric Levaquin , naproxen and tamsulosin. Symptoms have improved. Prior UroLift Dr. Gala 6-7 years ago History of urethral stricture and underwent periodic urethral dilation by Dr. Kassie.  He is having intermittent urinary incontinence which he states was a previous sign of worsening stricture IPSS today 12/35 PSA 05/20/2024 was 1.83 Prior history SWL for urinary calculi   PMH: Past Medical History:  Diagnosis Date   Arthritis    finger   Cancer ([facility])    skin cancer   Family history of adverse reaction to anesthesia    mother had some complication that cancelled surgery.  (pt thinks may have had something to do with atropine, but not sure)   GERD (gastroesophageal reflux disease)    High cholesterol    History of kidney stones    Hypertension    Hypothyroidism    Sleep apnea    had CPAP -hasn't used recently. system not functional   URI (upper respiratory infection)    dx'd 05/14/16. mild. starting meds    Surgical History: Past Surgical History:  Procedure Laterality Date   BREAST BIOPSY  Left 10/23/2020   2:00 3cmfn Q clip us  bx path pending   COLONOSCOPY WITH PROPOFOL  N/A 05/20/2016   Procedure: COLONOSCOPY WITH PROPOFOL ;  Surgeon: Rogelia Copping, MD;  Location: [facility];  Service: Endoscopy;  Laterality: N/A;   EXTRACORPOREAL SHOCK WAVE LITHOTRIPSY Right 10/24/2016   Procedure: EXTRACORPOREAL SHOCK WAVE LITHOTRIPSY (ESWL);  Surgeon: Ozell JONELLE Kassie, MD;  Location: [facility];  Service: Urology;  Laterality: Right;   NASAL SEPTUM SURGERY     POLYPECTOMY  05/20/2016   Procedure: POLYPECTOMY;  Surgeon: Rogelia Copping, MD;  Location: [facility];  Service: Endoscopy;;   TONSILLECTOMY     URETHRAL STRICTURE DILATATION     UVULOPALATOPHARYNGOPLASTY  2000    Home Medications:  Allergies as of 08/24/2024       Reactions   Oxycodone  Itching   Can take with benadryl  if he needs to use this medicine.   Pineapple Hives, Rash        Medication List        Accurate as of August 24, 2024 10:45 AM. If you have any questions, ask your nurse or doctor.          STOP taking these medications    hydrochlorothiazide  25 MG tablet Commonly known as: HYDRODIURIL    lisinopril  10 MG tablet Commonly known as: ZESTRIL    metroNIDAZOLE  500 MG tablet Commonly known as: Flagyl    Nucynta  50 MG tablet Generic drug: tapentadol    ondansetron  8 MG disintegrating tablet Commonly known as: Zofran  ODT   phentermine  30 MG capsule  testosterone 50 MG/5GM (1%) Gel Commonly known as: ANDROGEL   traZODone 50 MG tablet Commonly known as: DESYREL       TAKE these medications    ALPRAZolam  0.5 MG tablet Commonly known as: XANAX  Take 1 mg by mouth at bedtime as needed for sleep.   atorvastatin  10 MG tablet Commonly known as: LIPITOR Take 10 mg by mouth daily. What changed: Another medication with the same name was removed. Continue taking this medication, and follow the directions you see here.   cyanocobalamin 1000 MCG/ML injection Commonly known as: VITAMIN  B12 Inject 1,000 mcg into the muscle every 30 (thirty) days.   levothyroxine  88 MCG tablet Commonly known as: SYNTHROID  Take 88 mcg by mouth. What changed: Another medication with the same name was removed. Continue taking this medication, and follow the directions you see here.   lisinopril -hydrochlorothiazide  10-12.5 MG tablet Commonly known as: ZESTORETIC Take 1 tablet by mouth daily.   metFORMIN 500 MG 24 hr tablet Commonly known as: GLUCOPHAGE-XR Take by mouth.   omeprazole 40 MG capsule Commonly known as: PRILOSEC Take 40 mg by mouth at bedtime.   propranolol 10 MG tablet Commonly known as: INDERAL Take 10 mg by mouth 2 (two) times daily.   sildenafil 100 MG tablet Commonly known as: VIAGRA Take 100 mg by mouth.   tamsulosin 0.4 MG Caps capsule Commonly known as: FLOMAX Take 0.4 mg by mouth.   valACYclovir 1000 MG tablet Commonly known as: VALTREX Take by mouth.        Allergies: Allergies[1]  Family History: Family History  Problem Relation Age of Onset   Lung cancer Mother    Hypertension Mother    Emphysema Father    Diabetes Sister    Heart disease Maternal Grandfather     Social History:  reports that he has never smoked. He has never used smokeless tobacco. He reports current alcohol use of about 25.0 standard drinks of alcohol per week. He reports that he does not use drugs.   Physical Exam: BP (!) 145/86   Pulse 78   Ht 5' 9 (1.753 m)   Wt 195 lb (88.5 kg)   BMI 28.80 kg/m   Constitutional:  Alert, No acute distress. HEENT: [place] AT Respiratory: Normal respiratory effort, no increased work of breathing. Psychiatric: Normal mood and affect.   Assessment & Plan:    1.  Recurrent nephrolithiasis Patient states x-rays were performed in acute care though on review these were of the lumbar sacral spine.  Will obtain KUB on follow-up  2.  History urethral stricture Recent onset of lower urinary tract symptoms previously indicative of need  for stricture dilation Schedule cystoscopy with possible urethral dilation  3.  Hypogonadism Has not had a testosterone level checked in ~2 years.  Testosterone level drawn today.  Last injection was approximately 1 month ago  He did bring his prior [facility] records on a flash drive which will be downloaded in epic for review   Glendia JAYSON Barba, MD  [facility] [address] [place], [place] [address] [phone]     [1]  Allergies Allergen Reactions   Oxycodone  Itching    Can take with benadryl  if he needs to use this medicine.   Pineapple Hives and Rash   "

## 2024-08-24 NOTE — Progress Notes (Signed)
 Patient presents for an office visit. BP today is High. Greater than 140/90. Provider  notified and recheck Blood Pressure .  Pt advised to talk with PCP.  Pt voiced understanding.   Blood pressure returned to normal 125/81

## 2024-08-25 ENCOUNTER — Ambulatory Visit: Payer: Self-pay | Admitting: Urology

## 2024-08-25 LAB — TESTOSTERONE: Testosterone: 590 ng/dL (ref 264–916)

## 2024-09-03 ENCOUNTER — Ambulatory Visit: Admitting: Urology

## 2024-09-03 ENCOUNTER — Encounter: Payer: Self-pay | Admitting: Urology

## 2024-09-03 VITALS — BP 132/88 | HR 80 | Ht 69.0 in | Wt 192.0 lb

## 2024-09-03 DIAGNOSIS — E291 Testicular hypofunction: Secondary | ICD-10-CM

## 2024-09-03 DIAGNOSIS — Z87448 Personal history of other diseases of urinary system: Secondary | ICD-10-CM

## 2024-09-03 LAB — MICROSCOPIC EXAMINATION

## 2024-09-03 LAB — URINALYSIS, COMPLETE
Bilirubin, UA: NEGATIVE
Glucose, UA: NEGATIVE
Ketones, UA: NEGATIVE
Leukocytes,UA: NEGATIVE
Nitrite, UA: NEGATIVE
Specific Gravity, UA: 1.02 (ref 1.005–1.030)
Urobilinogen, Ur: 0.2 mg/dL (ref 0.2–1.0)
pH, UA: 6 (ref 5.0–7.5)

## 2024-09-03 NOTE — Progress Notes (Unsigned)
" ° °  09/03/24  CC:  Chief Complaint  Patient presents with   Cysto    HPI: Refer to my prior note 08/24/2024.  Has noted some increased urinary urgency this morning.  UA 3-10 RBC  Blood pressure 132/88, pulse 80, height 5' 9 (1.753 m), weight 192 lb (87.1 kg). NED. A&Ox3.   No respiratory distress   Abd soft, NT, ND Normal phallus with bilateral descended testicles  Cystoscopy Procedure Note  Patient identification was confirmed, informed consent was obtained, and patient was prepped using Betadine solution.  Lidocaine  jelly was administered per urethral meatus.     Pre-Procedure: - Inspection reveals a normal caliber urethral meatus.  Procedure: The flexible cystoscope was introduced without difficulty - Slight narrowing distal urethral noted which was able to be negotiated with the flexible cystoscope - Moderate lateral lobe enlargement prostate-loss of anterior channel status post UroLift - Normal bladder neck - Bilateral ureteral orifices identified - Bladder mucosa  reveals no ulcers, tumors, or lesions - No bladder stones - Moderate trabeculation  Retroflexion shows no intravesical median lobe, calculus or lesions   Post-Procedure: - Patient tolerated the procedure well  Assessment/ Plan: Mild distal urethral narrowing which was dilated with the flexible scope.  Declined additional dilation Has noted increased urgency; UA today with 3-10 RBC.  Recommend further evaluation with CT urogram.  Recent onset urgency could potentially be related to a distal stone ***check order    Glendia JAYSON Barba, MD "
